# Patient Record
Sex: Female | Born: 1980
Health system: Southern US, Community
[De-identification: ages and names within clinical notes are randomized; demographics above are authoritative.]

## PROBLEM LIST (undated history)

## (undated) DIAGNOSIS — F419 Anxiety disorder, unspecified: Secondary | ICD-10-CM

## (undated) DIAGNOSIS — N939 Abnormal uterine and vaginal bleeding, unspecified: Secondary | ICD-10-CM

## (undated) DIAGNOSIS — Z87442 Personal history of urinary calculi: Secondary | ICD-10-CM

## (undated) DIAGNOSIS — Z9889 Other specified postprocedural states: Secondary | ICD-10-CM

## (undated) DIAGNOSIS — Z8659 Personal history of other mental and behavioral disorders: Secondary | ICD-10-CM

## (undated) HISTORY — PX: TONSILLECTOMY: SUR1361

## (undated) HISTORY — PX: WISDOM TOOTH EXTRACTION: SHX21

## (undated) HISTORY — PX: TYMPANOSTOMY TUBE PLACEMENT: SHX32

---

## 2008-01-15 ENCOUNTER — Inpatient Hospital Stay (HOSPITAL_COMMUNITY): Admission: AD | Admit: 2008-01-15 | Discharge: 2008-01-18 | Payer: Self-pay | Admitting: Obstetrics and Gynecology

## 2011-08-03 LAB — CBC
Hemoglobin: 11.3 — ABNORMAL LOW
MCHC: 35
MCV: 90.1
RBC: 3.49 — ABNORMAL LOW
RBC: 4.35
WBC: 13.3 — ABNORMAL HIGH
WBC: 18.3 — ABNORMAL HIGH

## 2011-08-03 LAB — COMPREHENSIVE METABOLIC PANEL
ALT: 39 — ABNORMAL HIGH
AST: 48 — ABNORMAL HIGH
CO2: 24
Chloride: 103
GFR calc Af Amer: >60
GFR calc non Af Amer: >60
Sodium: 134 — ABNORMAL LOW
Total Bilirubin: 0.7

## 2011-08-03 LAB — URIC ACID: Uric Acid, Serum: 4.9

## 2014-09-06 ENCOUNTER — Other Ambulatory Visit (HOSPITAL_COMMUNITY)
Admission: RE | Admit: 2014-09-06 | Discharge: 2014-09-06 | Disposition: A | Discharge status: Home / Self Care | Payer: Self-pay | Source: Ambulatory Visit | Attending: Family Medicine | Admitting: Family Medicine

## 2014-09-06 ENCOUNTER — Other Ambulatory Visit: Payer: Self-pay | Admitting: Family Medicine

## 2014-09-06 DIAGNOSIS — Z01419 Encounter for gynecological examination (general) (routine) without abnormal findings: Secondary | ICD-10-CM | POA: Insufficient documentation

## 2014-09-07 LAB — CYTOLOGY - PAP

## 2017-06-09 ENCOUNTER — Other Ambulatory Visit: Payer: Self-pay | Admitting: Physician Assistant

## 2017-06-09 ENCOUNTER — Ambulatory Visit
Admission: RE | Admit: 2017-06-09 | Discharge: 2017-06-09 | Disposition: A | Discharge status: Home / Self Care | Payer: Self-pay | Source: Ambulatory Visit | Attending: Physician Assistant | Admitting: Physician Assistant

## 2017-06-09 DIAGNOSIS — M542 Cervicalgia: Secondary | ICD-10-CM
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
## 2019-08-23 ENCOUNTER — Other Ambulatory Visit (HOSPITAL_COMMUNITY)
Admission: RE | Admit: 2019-08-23 | Discharge: 2019-08-23 | Disposition: A | Discharge status: Home / Self Care | Payer: No Typology Code available for payment source | Source: Ambulatory Visit | Attending: Physician Assistant | Admitting: Physician Assistant

## 2019-08-23 ENCOUNTER — Other Ambulatory Visit: Payer: Self-pay | Admitting: Physician Assistant

## 2019-08-23 DIAGNOSIS — Z0000 Encounter for general adult medical examination without abnormal findings: Secondary | ICD-10-CM | POA: Diagnosis not present

## 2019-08-29 LAB — CYTOLOGY - PAP
Comment: NEGATIVE
Diagnosis: NEGATIVE
High risk HPV: POSITIVE — AB

## 2020-02-15 ENCOUNTER — Encounter (HOSPITAL_BASED_OUTPATIENT_CLINIC_OR_DEPARTMENT_OTHER): Payer: Self-pay | Admitting: Obstetrics & Gynecology

## 2020-02-15 ENCOUNTER — Other Ambulatory Visit: Payer: Self-pay

## 2020-02-15 NOTE — Progress Notes (Signed)
Spoke w/ via phone for pre-op interview--- PT Lab needs dos----  Urine preg            Lab results---- getting CBC, BMP done 02-20-2020 @ 1515 COVID test ------  02-20-2020 @ 1445 Arrive at -------  1030 NPO after ------  MN Medications to take morning of surgery ----- NONE Diabetic medication ----- n/a Patient Special Instructions ----- n/a Pre-Op special Istructions ----- n/a Patient verbalized understanding of instructions that were given at this phone interview. Patient denies shortness of breath, chest pain, fever, cough a this phone interview.

## 2020-02-17 NOTE — H&P (Signed)
39yo G2P2 who presents for hysteroscopy, D&C,possible polypectomy and HTA ablation due to AUB        Previously noted long standing h/o irregular bleeding. Sometimes her periods are monthly or even twice a month, sometimes she may go up to 53mos without a period. Her cycles have always been unpredictable. Usually her period will last about a week with 2-3 heavy days. However, at times, her period has lasted up to 3 weeks. On a heavy day she may soak through a tampon in under . with passage of clots. Moderate dysmenorrhea        She also notes postcoital bleeding- not all the time. Bleeding will be bright red to brown spotting. In the past, she had tried OCPs to help regulate her period, but noted significant mood changes and would prefer to avoid hormones.         Work up included:        EMB (12/06/19)- benign proliferative endometrium, endomtrial polyp        SHG (12/25/19): 7.4cm retroverted uterus- normal size and hsape. Thickened endometrium- following SHG hyperechoic 1.8cm fundal mass- no BF noted. Bilateral ovaries with multiple tiny follicles seen throughout. No adnexal masses seen        Contraception: vasectomy        08/2019- Pap neg, HPV+, []  repeat exam due 08/2020        08/23/2019: Hgb 14.9.    Current Medications  TakingXanax(ALPRAZolam) 0.25 MG Tablet 1 tablet as needed Orally (30 day supply) Three times a day, Notes: as needed    MVI as directed    Probiotic Capsule Orally      Past Medical History       Irregular menses.    Surgical History  tonsillectomy      Family History  Father: alive 59 yrs, hypertension, diagnosed with Hypertension  Mother: alive 68 yrs, healthy  Paternal Grand Father: deceased 71 yrs, pacemaker  Paternal Grand Mother: deceased, renal disease dialysis 17  Maternal Grand Father: deceased, renal disease  Maternal Grand Mother: alive 44 yrs, diabetes, diet controlled  Brother 1: alive, healthy  Sister 1: alive, healthy  Sister 2: alive,  healthy  1 brother(s) , 2 sister(s) . 2 son(s) - healthy.       Social History  General:         Tobacco use               cigarettes:  Former smoker             Quit in year  2019 off an on since 2008             Tobacco history last updated  02/14/2020             Vaping  No       Alcohol: yes, Rare, 2+ per week, wine.        Caffeine: coffee, green tea.        no Recreational drug use.        Exercise: 3-4 weekly.        Marital Status: Married, 04/15/2020..        Children: Ivin Booty.        EDUCATION: [employer] and 4 years of college.        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        Religion: none.        Seat belt use: yes.  no Tobacco Exposure.     Gyn History  Sexual activity currently sexually active.   Periods : irregular.   LMP 02-04-2020.   Birth control vasectomy.   Last pap smear date 08-2019 normal .   Abnormal pap smear none.      OB History  Number of pregnancies  2.   Pregnancy # 1  vaginal delivery.   Pregnancy # 2  vaginal delivery.      Allergies  N.K.D.A.      Hospitalization/Major Diagnostic Procedure  kidney infection second grade   childbirth x 2 NVD       Review of Systems  CONSTITUTIONAL:          no Chills.  Fatigue yes.  no Fever.  Night sweats yes.      HEENT:          Blurrred vision no.  no Double vision.      CARDIOLOGY:          no Chest pain.      RESPIRATORY:          no Shortness of breath.  no Cough.      UROLOGY:          Urinary urgency yes.  Urinary frequency yes.  no Urinary incontinence.      GASTROENTEROLOGY:          no Abdominal pain.  no Appetite change.  no Change in bowel movements.      FEMALE REPRODUCTIVE:          See HPI for details.  no Breast lumps or discharge.  no Breast pain.      NEUROLOGY:          no Dizziness.  no Headache.  no Loss of consciousness.      PSYCHOLOGY:          Anxiety yes.  no Depression.      SKIN:          no Rash.  no  Hives.      HEMATOLOGY/LYMPH:          no Anemia.  Using Blood Thinners no.        Examination  Vital Signs  Wt 137.2, Wt change -.8 lb, Ht 66.5, BMI 21.81, Temp 97.0, BP sitting 112/72.    Examination  General Examination:        CONSTITUTIONAL: well developed, well nourished.         SKIN: warm and dry, no rashes.         NECK: supple, normal appearance.         LUNGS: clear to auscultation bilaterally, no wheezes, rhonchi, rales.         HEART: no murmurs, regular rate and rhythm.         ABDOMEN:  soft and not tender.         FEMALE GENITOURINARY:  normal external genitalia, labia - unremarkable, vagina - pink moist mucosa, no lesions or abnormal discharge, cervix - no discharge or lesions or CMT.         MUSCULOSKELETAL  no calf tenderness bilaterally.         EXTREMITIES:  no edema present.         PSYCH:  appropriate mood and affect.      A/P: 39yo G2P2 who presents for hysteroscopy, D&C,possible polypectomy and HTA ablation due to AUB  -NPO -LR @ 125cc/hr -SCDs to OR -Risk/benefits reviewed with patient including but not limited to risk of bleeding,  infection and injury or potential failure of the ablation.  Questions and concerns were addressed and she desires to proceed  Janyth Pupa, DO [phone] (cell) [phone] (office)

## 2020-02-20 ENCOUNTER — Other Ambulatory Visit: Payer: Self-pay

## 2020-02-20 ENCOUNTER — Other Ambulatory Visit (HOSPITAL_COMMUNITY)
Admission: RE | Admit: 2020-02-20 | Discharge: 2020-02-20 | Disposition: A | Discharge status: Home / Self Care | Payer: No Typology Code available for payment source | Source: Ambulatory Visit | Attending: Obstetrics & Gynecology | Admitting: Obstetrics & Gynecology

## 2020-02-20 ENCOUNTER — Encounter (HOSPITAL_COMMUNITY)
Admission: RE | Admit: 2020-02-20 | Discharge: 2020-02-20 | Disposition: A | Discharge status: Home / Self Care | Payer: No Typology Code available for payment source | Source: Ambulatory Visit

## 2020-02-20 DIAGNOSIS — Z20822 Contact with and (suspected) exposure to covid-19: Secondary | ICD-10-CM | POA: Insufficient documentation

## 2020-02-20 DIAGNOSIS — Z01812 Encounter for preprocedural laboratory examination: Secondary | ICD-10-CM | POA: Insufficient documentation

## 2020-02-20 LAB — BASIC METABOLIC PANEL
Anion gap: 9 (ref 5–15)
BUN: 10 mg/dL (ref 6–20)
CO2: 27 mmol/L (ref 22–32)
Calcium: 9.7 mg/dL (ref 8.9–10.3)
Chloride: 102 mmol/L (ref 98–111)
Creatinine, Ser: 0.79 mg/dL (ref 0.44–1.00)
GFR calc Af Amer: >60 mL/min (ref >60)
GFR calc non Af Amer: >60 mL/min (ref >60)
Glucose, Bld: 87 mg/dL (ref 70–99)
Potassium: 4.3 mmol/L (ref 3.5–5.1)
Sodium: 138 mmol/L (ref 135–145)

## 2020-02-20 LAB — CBC
HCT: 44 % (ref 36.0–46.0)
Hemoglobin: 14.5 g/dL (ref 12.0–15.0)
MCH: 31.1 pg (ref 26.0–34.0)
MCHC: 33 g/dL (ref 30.0–36.0)
MCV: 94.4 fL (ref 80.0–100.0)
Platelets: 275 10*3/uL (ref 150–400)
RBC: 4.66 MIL/uL (ref 3.87–5.11)
RDW: 11.9 % (ref 11.5–15.5)
WBC: 7.9 10*3/uL (ref 4.0–10.5)
nRBC: 0 % (ref 0.0–0.2)

## 2020-02-20 LAB — SARS CORONAVIRUS 2 (TAT 6-24 HRS): SARS Coronavirus 2: NEGATIVE

## 2020-02-23 ENCOUNTER — Ambulatory Visit (HOSPITAL_BASED_OUTPATIENT_CLINIC_OR_DEPARTMENT_OTHER): Payer: No Typology Code available for payment source | Admitting: Anesthesiology

## 2020-02-23 ENCOUNTER — Other Ambulatory Visit: Payer: Self-pay

## 2020-02-23 ENCOUNTER — Encounter (HOSPITAL_BASED_OUTPATIENT_CLINIC_OR_DEPARTMENT_OTHER)
Admission: RE | Disposition: A | Discharge status: Home / Self Care | Payer: Self-pay | Source: Home / Self Care | Attending: Obstetrics & Gynecology

## 2020-02-23 ENCOUNTER — Ambulatory Visit (HOSPITAL_BASED_OUTPATIENT_CLINIC_OR_DEPARTMENT_OTHER)
Admission: RE | Admit: 2020-02-23 | Discharge: 2020-02-23 | Disposition: A | Discharge status: Home / Self Care | Payer: No Typology Code available for payment source | Attending: Obstetrics & Gynecology | Admitting: Obstetrics & Gynecology

## 2020-02-23 ENCOUNTER — Encounter (HOSPITAL_BASED_OUTPATIENT_CLINIC_OR_DEPARTMENT_OTHER): Payer: Self-pay | Admitting: Obstetrics & Gynecology

## 2020-02-23 DIAGNOSIS — Z8249 Family history of ischemic heart disease and other diseases of the circulatory system: Secondary | ICD-10-CM | POA: Diagnosis not present

## 2020-02-23 DIAGNOSIS — F419 Anxiety disorder, unspecified: Secondary | ICD-10-CM | POA: Insufficient documentation

## 2020-02-23 DIAGNOSIS — Z87891 Personal history of nicotine dependence: Secondary | ICD-10-CM | POA: Insufficient documentation

## 2020-02-23 DIAGNOSIS — N840 Polyp of corpus uteri: Secondary | ICD-10-CM | POA: Diagnosis not present

## 2020-02-23 DIAGNOSIS — N938 Other specified abnormal uterine and vaginal bleeding: Secondary | ICD-10-CM | POA: Insufficient documentation

## 2020-02-23 DIAGNOSIS — Z841 Family history of disorders of kidney and ureter: Secondary | ICD-10-CM | POA: Diagnosis not present

## 2020-02-23 DIAGNOSIS — Z833 Family history of diabetes mellitus: Secondary | ICD-10-CM | POA: Diagnosis not present

## 2020-02-23 DIAGNOSIS — N939 Abnormal uterine and vaginal bleeding, unspecified: Secondary | ICD-10-CM

## 2020-02-23 HISTORY — PX: DILITATION & CURRETTAGE/HYSTROSCOPY WITH HYDROTHERMAL ABLATION: SHX5570

## 2020-02-23 HISTORY — DX: Abnormal uterine and vaginal bleeding, unspecified: N93.9

## 2020-02-23 HISTORY — DX: Anxiety disorder, unspecified: F41.9

## 2020-02-23 HISTORY — DX: Personal history of other mental and behavioral disorders: Z86.59

## 2020-02-23 LAB — POCT PREGNANCY, URINE: Preg Test, Ur: NEGATIVE

## 2020-02-23 SURGERY — DILATATION & CURETTAGE/HYSTEROSCOPY WITH HYDROTHERMAL ABLATION
Anesthesia: General | Site: Uterus

## 2020-02-23 MED ORDER — KETOROLAC TROMETHAMINE 30 MG/ML IJ SOLN
INTRAMUSCULAR | Status: DC | PRN
  Administered 2020-02-23: 30 mg via INTRAVENOUS

## 2020-02-23 MED ORDER — FENTANYL CITRATE (PF) 100 MCG/2ML IJ SOLN
INTRAMUSCULAR | Status: AC
  Filled 2020-02-23: qty 2

## 2020-02-23 MED ORDER — PROPOFOL 10 MG/ML IV BOLUS
INTRAVENOUS | Status: AC
  Filled 2020-02-23: qty 20

## 2020-02-23 MED ORDER — LACTATED RINGERS IV SOLN
INTRAVENOUS | Status: DC
  Filled 2020-02-23: qty 1000

## 2020-02-23 MED ORDER — LIDOCAINE-EPINEPHRINE 1 %-1:100000 IJ SOLN
INTRAMUSCULAR | Status: AC
  Filled 2020-02-23: qty 1

## 2020-02-23 MED ORDER — SCOPOLAMINE 1 MG/3DAYS TD PT72
MEDICATED_PATCH | TRANSDERMAL | Status: DC | PRN
  Administered 2020-02-23: 1 via TRANSDERMAL

## 2020-02-23 MED ORDER — ACETAMINOPHEN 500 MG PO TABS
1000.0000 mg | ORAL_TABLET | Freq: Once | ORAL | Status: AC
  Administered 2020-02-23: 1000 mg via ORAL
  Filled 2020-02-23: qty 2

## 2020-02-23 MED ORDER — MIDAZOLAM HCL 2 MG/2ML IJ SOLN
INTRAMUSCULAR | Status: AC
  Filled 2020-02-23: qty 2

## 2020-02-23 MED ORDER — OXYCODONE HCL 5 MG PO TABS
5.0000 mg | ORAL_TABLET | Freq: Once | ORAL | Status: DC | PRN
  Filled 2020-02-23: qty 1

## 2020-02-23 MED ORDER — ACETAMINOPHEN 500 MG PO TABS
ORAL_TABLET | ORAL | Status: AC
  Filled 2020-02-23: qty 2

## 2020-02-23 MED ORDER — LIDOCAINE 2% (20 MG/ML) 5 ML SYRINGE
INTRAMUSCULAR | Status: DC | PRN
  Administered 2020-02-23: 60 mg via INTRAVENOUS

## 2020-02-23 MED ORDER — LIDOCAINE 2% (20 MG/ML) 5 ML SYRINGE
INTRAMUSCULAR | Status: AC
  Filled 2020-02-23: qty 5

## 2020-02-23 MED ORDER — FENTANYL CITRATE (PF) 100 MCG/2ML IJ SOLN
INTRAMUSCULAR | Status: DC | PRN
  Administered 2020-02-23: 50 ug via INTRAVENOUS

## 2020-02-23 MED ORDER — LIDOCAINE-EPINEPHRINE 1 %-1:100000 IJ SOLN
INTRAMUSCULAR | Status: DC | PRN
  Administered 2020-02-23: 20 mL

## 2020-02-23 MED ORDER — DEXAMETHASONE SODIUM PHOSPHATE 10 MG/ML IJ SOLN
INTRAMUSCULAR | Status: DC | PRN
  Administered 2020-02-23: 10 mg via INTRAVENOUS

## 2020-02-23 MED ORDER — PROMETHAZINE HCL 25 MG/ML IJ SOLN
6.2500 mg | Freq: Once | INTRAMUSCULAR | Status: AC | PRN
  Administered 2020-02-23: 6.25 mg via INTRAVENOUS
  Filled 2020-02-23: qty 1

## 2020-02-23 MED ORDER — MIDAZOLAM HCL 2 MG/2ML IJ SOLN
INTRAMUSCULAR | Status: DC | PRN
  Administered 2020-02-23: 1 mg via INTRAVENOUS
  Administered 2020-02-23: 2 mg via INTRAVENOUS

## 2020-02-23 MED ORDER — FENTANYL CITRATE (PF) 100 MCG/2ML IJ SOLN
25.0000 ug | INTRAMUSCULAR | Status: DC | PRN
  Filled 2020-02-23: qty 1

## 2020-02-23 MED ORDER — DEXAMETHASONE SODIUM PHOSPHATE 10 MG/ML IJ SOLN
INTRAMUSCULAR | Status: AC
  Filled 2020-02-23: qty 1

## 2020-02-23 MED ORDER — ONDANSETRON HCL 4 MG/2ML IJ SOLN
INTRAMUSCULAR | Status: AC
  Filled 2020-02-23: qty 2

## 2020-02-23 MED ORDER — SODIUM CHLORIDE 0.9 % IR SOLN
Status: DC | PRN
  Administered 2020-02-23: 3000 mL

## 2020-02-23 MED ORDER — PROMETHAZINE HCL 25 MG/ML IJ SOLN
INTRAMUSCULAR | Status: AC
  Filled 2020-02-23: qty 1

## 2020-02-23 MED ORDER — ONDANSETRON HCL 4 MG/2ML IJ SOLN
INTRAMUSCULAR | Status: DC | PRN
  Administered 2020-02-23: 4 mg via INTRAVENOUS

## 2020-02-23 MED ORDER — SILVER NITRATE-POT NITRATE 75-25 % EX MISC
CUTANEOUS | Status: AC
  Filled 2020-02-23: qty 10

## 2020-02-23 MED ORDER — KETOROLAC TROMETHAMINE 30 MG/ML IJ SOLN
INTRAMUSCULAR | Status: AC
  Filled 2020-02-23: qty 1

## 2020-02-23 MED ORDER — PROPOFOL 10 MG/ML IV BOLUS
INTRAVENOUS | Status: DC | PRN
  Administered 2020-02-23: 200 mg via INTRAVENOUS

## 2020-02-23 SURGICAL SUPPLY — 17 items
CATH ROBINSON RED A/P 16FR (CATHETERS) IMPLANT
DEVICE MYOSURE REACH (MISCELLANEOUS) ×4 IMPLANT
DILATOR CANAL MILEX (MISCELLANEOUS) IMPLANT
GAUZE 4X4 16PLY RFD (DISPOSABLE) ×4 IMPLANT
GLOVE BIOGEL PI IND STRL 6.5 (GLOVE) ×2 IMPLANT
GLOVE BIOGEL PI IND STRL 7.0 (GLOVE) ×2 IMPLANT
GLOVE BIOGEL PI INDICATOR 6.5 (GLOVE) ×2
GLOVE BIOGEL PI INDICATOR 7.0 (GLOVE) ×2
GLOVE ECLIPSE 6.5 STRL STRAW (GLOVE) ×4 IMPLANT
GOWN STRL REUS W/ TWL LRG LVL3 (GOWN DISPOSABLE) ×2 IMPLANT
GOWN STRL REUS W/TWL LRG LVL3 (GOWN DISPOSABLE) ×6 IMPLANT
PACK VAGINAL MINOR WOMEN LF (CUSTOM PROCEDURE TRAY) ×4 IMPLANT
PAD OB MATERNITY 4.3X12.25 (PERSONAL CARE ITEMS) ×4 IMPLANT
PAD PREP 24X48 CUFFED NSTRL (MISCELLANEOUS) ×4 IMPLANT
SET GENESYS HTA PROCERVA (MISCELLANEOUS) ×4 IMPLANT
SLEEVE SCD COMPRESS KNEE MED (MISCELLANEOUS) ×8 IMPLANT
TOWEL OR 17X26 10 PK STRL BLUE (TOWEL DISPOSABLE) ×4 IMPLANT

## 2020-02-23 NOTE — Transfer of Care (Signed)
   Last Vitals:  Vitals Value Taken Time  BP 145/96 02/23/20 1321  Temp    Pulse 84 02/23/20 1323  Resp 17 02/23/20 1323  SpO2 100 % 02/23/20 1323  Vitals shown include unvalidated device data.  Last Pain:  Vitals:   02/23/20 1028  TempSrc: Oral  PainSc: 0-No pain      Patients Stated Pain Goal: 6 (02/23/20 1028) Immediate Anesthesia Transfer of Care Note  Patient: Paytyn Breitenstein  Procedure(s) Performed: Procedure(s) (LRB): DILATATION & CURETTAGE/HYSTEROSCOPY WITH HYDROTHERMAL ABLATION AND POLYPECTOMY WITH MYOSURE (N/A)  Patient Location: PACU  Anesthesia Type: General  Level of Consciousness: awake, alert  and oriented  Airway & Oxygen Therapy: Patient Spontanous Breathing and Patient connected to nasal cannula oxygen  Post-op Assessment: Report given to PACU RN and Post -op Vital signs reviewed and stable  Post vital signs: Reviewed and stable  Complications: No apparent anesthesia complications

## 2020-02-23 NOTE — Op Note (Signed)
Operative Report  PreOp: 1) abnormal uterine bleeding 2) uterine polyp PostOp: same Procedure:  Hysteroscopy, Dilation and Curettage, polypectomy, Endometrial ablation Surgeon: Dr. Myna Hidalgo Anesthesia: General and cervical block Complications:none EBL: UOP: 10cc IVF:1000cc  Discrepancy: 290cc  Findings:8cm retroverted uterus with proliferative endometrium and 1cm uterine polyp, both ostia visualized  Specimens: endometrial polyp and curettings  Procedure: The patient was taken to the operating room where she underwent general anesthesia without difficulty. The patient was placed in a low lithotomy position using Allen stirrups. The patient was examined with the findings as noted above.  She was then prepped and draped in the normal sterile fashion. The bladder was drained using a red rubber urethral catheter.20cc of 1% lidocaine with epinephrine was used for a cervical block.  A sterile speculum was inserted into the vagina. A single tooth tenaculum was placed on the anterior lip of the cervix. The uterus was then sounded to 8cm. The diagnostic hysteroscope was then inserted without difficulty and noted to have the findings as listed above. The myosure was then used for resection of the polyp and for curettage.  The hysteroscope was removed and the tissue was sent to pathology.   Attention was then turned to the HTA ablation. The system was set up according to manufacture instructions. Ablation was completed under direct visualization. Global ablation was visualized and no uterine perforation was seen. All instrument were then removed. Hemostasis was observed at the cervical site. The patient was repositioned to the supine position. The patient tolerated the procedure without any complications and taken to recovery in stable condition.   Myna Hidalgo, DO [phone] (pager) [phone] (office)

## 2020-02-23 NOTE — Discharge Instructions (Addendum)
HOME INSTRUCTIONS  Please note any unusual or excessive bleeding, pain, swelling. Mild dizziness or drowsiness are normal for about 24 hours after surgery.   Shower when comfortable  Restrictions: No driving for 24 hours or while taking pain medications.  Activity:  No heavy lifting (> 10 lbs), nothing in vagina (no tampons, douching, or intercourse) x 2 weeks; no tub baths for 2 weeks Vaginal spotting is expected but if your bleeding is heavy, period like,  please call the office    Diet:  You may return to your regular diet.  Do not eat large meals.  Eat small frequent meals throughout the day.  Continue to drink a good amount of water at least 6-8 glasses of water per day, hydration is very important for the healing process.  Pain Management: Take Motrin and/or tylenol as needed for pain.  Always take prescription pain medication with food, it may cause constipation, increase fluids and fiber and you may want to take an over-the-counter stool softener like Colace as needed up to 2x a day.    Alcohol -- Avoid for 24 hours and while taking pain medications.  Nausea: Take sips of ginger ale or soda  Fever -- Call physician if temperature over 101 degrees  Follow up:  If you do not already have a follow up appointment scheduled, please call the office at [phone].  If you experience fever (a temperature greater than 100.4), pain unrelieved by pain medication, shortness of breath, swelling of a single leg, or any other symptoms which are concerning to you please the office immediately.   Post Anesthesia Home Care Instructions  Activity: Get plenty of rest for the remainder of the day. A responsible adult should stay with you for 24 hours following the procedure.  For the next 24 hours, DO NOT: -Drive a car -Advertising copywriter -Drink alcoholic beverages -Take any medication unless instructed by your physician -Make any legal decisions or sign important papers.  Meals: Start  with liquid foods such as gelatin or soup. Progress to regular foods as tolerated. Avoid greasy, spicy, heavy foods. If nausea and/or vomiting occur, drink only clear liquids until the nausea and/or vomiting subsides. Call your physician if vomiting continues.  Special Instructions/Symptoms: Your throat may feel dry or sore from the anesthesia or the breathing tube placed in your throat during surgery. If this causes discomfort, gargle with warm salt water. The discomfort should disappear within 24 hours.  If you had a scopolamine patch placed behind your ear for the management of post- operative nausea and/or vomiting:  1. The medication in the patch is effective for 72 hours, after which it should be removed.  Wrap patch in a tissue and discard in the trash. Wash hands thoroughly with soap and water. 2. You may remove the patch earlier than 72 hours if you experience unpleasant side effects which may include dry mouth, dizziness or visual disturbances. 3. Avoid touching the patch. Wash your hands with soap and water after contact with the patch.

## 2020-02-23 NOTE — Anesthesia Procedure Notes (Addendum)
Procedure Name: LMA Insertion Date/Time: 02/23/2020 12:37 PM Performed by: Norva Pavlov, CRNA Pre-anesthesia Checklist: Patient identified, Emergency Drugs available, Suction available and Patient being monitored Patient Re-evaluated:Patient Re-evaluated prior to induction Oxygen Delivery Method: Circle system utilized Preoxygenation: Pre-oxygenation with 100% oxygen Induction Type: IV induction Ventilation: Mask ventilation without difficulty LMA: LMA inserted LMA Size: 3.0 Number of attempts: 2 (Attempted to place 4LMA without success due to small mouth.  3 LMA placed with ease.Marland Kitchen) Airway Equipment and Method: Bite block Placement Confirmation: positive ETCO2 Tube secured with: Tape Dental Injury: Teeth and Oropharynx as per pre-operative assessment

## 2020-02-23 NOTE — Interval H&P Note (Signed)
History and Physical Interval Note:  02/23/2020 12:10 PM  Connie Turner  has presented today for surgery, with the diagnosis of N93.9 abnormal uterine bleeding N84.0 uterine polyp.  The various methods of treatment have been discussed with the patient and family. After consideration of risks, benefits and other options for treatment, the patient has consented to  Procedure(s): DILATATION & CURETTAGE/HYSTEROSCOPY WITH HYDROTHERMAL ABLATION AND POLYPECTOMY WITH MYOSURE (N/A) DILATATION & CURETTAGE/HYSTEROSCOPY WITH MYOSURE (N/A) as a surgical intervention.  The patient's history has been reviewed, patient examined, no change in status, stable for surgery.  I have reviewed the patient's chart and labs.  Questions were answered to the patient's satisfaction.     Sharon Seller

## 2020-02-23 NOTE — Anesthesia Preprocedure Evaluation (Addendum)
Anesthesia Evaluation  Patient identified by MRN, date of birth, ID band Patient awake    Reviewed: Allergy & Precautions, NPO status , Patient's Chart, lab work & pertinent test results  Airway Mallampati: II  TM Distance: >3 FB Neck ROM: Full    Dental no notable dental hx. (+) Teeth Intact, Dental Advisory Given   Pulmonary neg pulmonary ROS, former smoker,    Pulmonary exam normal breath sounds clear to auscultation       Cardiovascular negative cardio ROS Normal cardiovascular exam Rhythm:Regular Rate:Normal     Neuro/Psych PSYCHIATRIC DISORDERS Anxiety negative neurological ROS     GI/Hepatic negative GI ROS, Neg liver ROS,   Endo/Other  negative endocrine ROS  Renal/GU negative Renal ROS  negative genitourinary   Musculoskeletal negative musculoskeletal ROS (+)   Abdominal   Peds  Hematology negative hematology ROS (+)   Anesthesia Other Findings AUB  Reproductive/Obstetrics                            Anesthesia Physical Anesthesia Plan  ASA: II  Anesthesia Plan: General   Post-op Pain Management:    Induction: Intravenous  PONV Risk Score and Plan: 3 and Ondansetron, Dexamethasone and Midazolam  Airway Management Planned: LMA  Additional Equipment:   Intra-op Plan:   Post-operative Plan: Extubation in OR  Informed Consent: I have reviewed the patients History and Physical, chart, labs and discussed the procedure including the risks, benefits and alternatives for the proposed anesthesia with the patient or authorized representative who has indicated his/her understanding and acceptance.     Dental advisory given  Plan Discussed with: CRNA  Anesthesia Plan Comments:         Anesthesia Quick Evaluation

## 2020-02-26 LAB — SURGICAL PATHOLOGY

## 2020-02-26 NOTE — Anesthesia Postprocedure Evaluation (Signed)
Anesthesia Post Note  Patient: Sohana Zakarian  Procedure(s) Performed: DILATATION & CURETTAGE/HYSTEROSCOPY WITH HYDROTHERMAL ABLATION AND POLYPECTOMY WITH MYOSURE (N/A Uterus)     Patient location during evaluation: PACU Anesthesia Type: General Level of consciousness: awake and alert Pain management: pain level controlled Vital Signs Assessment: post-procedure vital signs reviewed and stable Respiratory status: spontaneous breathing, nonlabored ventilation, respiratory function stable and patient connected to nasal cannula oxygen Cardiovascular status: blood pressure returned to baseline and stable Postop Assessment: no apparent nausea or vomiting Anesthetic complications: no    Last Vitals:  Vitals:   02/23/20 1400 02/23/20 1500  BP: (!) 155/91 (!) 155/98  Pulse: 64 64  Resp: 16 16  Temp:  36.6 C  SpO2: 100% 100%    Last Pain:  Vitals:   02/23/20 1500  TempSrc:   PainSc: 6                  Harlon Kutner L Lakevia Perris

## 2020-10-14 ENCOUNTER — Other Ambulatory Visit (HOSPITAL_COMMUNITY): Payer: Self-pay | Admitting: Physician Assistant

## 2020-10-14 MED FILL — ALPRAZolam 0.25 MG TABS: 0.25 | 30 days supply | Qty: 30 | Fill #0

## 2020-10-24 MED FILL — ALPRAZolam 0.25 MG TABS: 0.25 | 10 days supply | Qty: 30 | Fill #0

## 2020-12-03 ENCOUNTER — Other Ambulatory Visit (HOSPITAL_COMMUNITY): Payer: Self-pay | Admitting: Family Medicine

## 2020-12-03 MED FILL — FLUTICASONE PROP 50 MCG SPR: 50 | 30 days supply | Qty: 16 | Fill #0

## 2020-12-04 ENCOUNTER — Other Ambulatory Visit: Payer: Self-pay

## 2020-12-04 DIAGNOSIS — I8393 Asymptomatic varicose veins of bilateral lower extremities: Secondary | ICD-10-CM

## 2020-12-16 ENCOUNTER — Other Ambulatory Visit (HOSPITAL_COMMUNITY): Payer: Self-pay | Admitting: Physician Assistant

## 2020-12-16 MED FILL — predniSONE 10 MG (21) TBPK: 10 | 6 days supply | Qty: 21 | Fill #0

## 2020-12-25 ENCOUNTER — Ambulatory Visit (INDEPENDENT_AMBULATORY_CARE_PROVIDER_SITE_OTHER): Payer: No Typology Code available for payment source | Admitting: Physician Assistant

## 2020-12-25 ENCOUNTER — Ambulatory Visit (HOSPITAL_COMMUNITY)
Admission: RE | Admit: 2020-12-25 | Discharge: 2020-12-25 | Disposition: A | Discharge status: Home / Self Care | Payer: No Typology Code available for payment source | Source: Ambulatory Visit | Attending: Vascular Surgery | Admitting: Vascular Surgery

## 2020-12-25 ENCOUNTER — Other Ambulatory Visit: Payer: Self-pay

## 2020-12-25 VITALS — BP 126/88 | HR 95 | Temp 98.6°F | Resp 20 | Ht 66.0 in | Wt 146.6 lb

## 2020-12-25 DIAGNOSIS — I8393 Asymptomatic varicose veins of bilateral lower extremities: Secondary | ICD-10-CM | POA: Diagnosis present

## 2020-12-25 NOTE — Progress Notes (Signed)
VASCULAR & VEIN SPECIALISTS           [facility] [place]  History and Physical   Connie Turner is a 40 y.o. female who presents with varicose veins in both legs.  She states that her mother and her grandmother both have varicose veins and swelling and she is wanting to get ahead of it now.  She has hx of pregnancy with the first being in 2008.  She states that she started getting cramping at night in her legs after that.  She does not get cramping in her legs any other time.  She does not have hx of DVT.  She does not really have any swelling in her legs at this point.  She states that when her feet are cold, they turn a purplish color.  She states that she has a small bulging vein on the right inner thigh.  She does have some small spider veins on the backs of her legs. She has not ever worn compression socks.  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   The pt is not on a statin for cholesterol management.  The pt is not on a daily aspirin.   Other AC:  none The pt is not on medication for hypertension.   The pt is not diabetic.   Tobacco hx:  former   Past Medical History:  Diagnosis Date  . Abnormal uterine bleeding (AUB)   . Anxiety   . History of panic attacks     Past Surgical History:  Procedure Laterality Date  . DILITATION & CURRETTAGE/HYSTROSCOPY WITH HYDROTHERMAL ABLATION N/A 02/23/2020   Procedure: DILATATION & CURETTAGE/HYSTEROSCOPY WITH HYDROTHERMAL ABLATION AND POLYPECTOMY WITH MYOSURE;  Surgeon: Myna Hidalgozan, Jennifer, DO;  Location: [facility];  Service: Gynecology;  Laterality: N/A;  . TONSILLECTOMY  child   and tympanostomy tube placement  . TYMPANOSTOMY TUBE PLACEMENT Bilateral child    Social History   Socioeconomic History  . Marital status: Married    Spouse name: Not on file  . Number of children: Not on file  . Years of education: Not on file  . Highest education level: Not on file   Occupational History  . Not on file  Tobacco Use  . Smoking status: Former Smoker    Years: 10.00    Types: Cigarettes    Quit date: 02/01/2020    Years since quitting: 0.8  . Smokeless tobacco: Never Used  . Tobacco comment: 02-15-2020  last smoked 2 wks ago, average 1 cig per day,  is an off and on smoker  Vaping Use  . Vaping Use: Never used  Substance and Sexual Activity  . Alcohol use: Yes    Comment: occasional  . Drug use: Never  . Sexual activity: Not on file  Other Topics Concern  . Not on file  Social History Narrative  . Not on file   Social Determinants of Health   Financial Resource Strain: Not on file  Food Insecurity: Not on file  Transportation Needs: Not on file  Physical Activity: Not on file  Stress: Not on file  Social Connections: Not on file  Intimate Partner Violence: Not on file    Family History  Problem Relation Age of Onset  . Varicose Veins Mother   . Varicose Veins Maternal Grandmother     Current Outpatient Medications  Medication Sig Dispense Refill  . ALPRAZolam Prudy Feeler(XANAX)  0.25 MG tablet Take 0.25 mg by mouth 3 (three) times daily as needed for anxiety.    . diphenhydrAMINE HCl (BENADRYL ALLERGY PO) Take by mouth as needed.    . fluticasone (FLONASE) 50 MCG/ACT nasal spray 1 spray in each nostril    . ibuprofen (ADVIL) 200 MG tablet Take 200 mg by mouth every 6 (six) hours as needed.    . Multiple Vitamin (MULTIVITAMIN) tablet Take 1 tablet by mouth daily.    . Probiotic Product (PROBIOTIC DAILY) CAPS Take by mouth daily.    . Probiotic Product (PROBIOTIC-10 PO)      No current facility-administered medications for this visit.    No Known Allergies  REVIEW OF SYSTEMS:   [X]  denotes positive finding, [ ]  denotes negative finding Cardiac  Comments:  Chest pain or chest pressure:    Shortness of breath upon exertion:    Short of breath when lying flat:    Irregular heart rhythm:        Vascular    Pain in calf, thigh, or hip  brought on by ambulation:    Pain in feet at night that wakes you up from your sleep:  x See HPI  Blood clot in your veins:    Leg swelling:         Pulmonary    Oxygen at home:    Productive cough:     Wheezing:         Neurologic    Sudden weakness in arms or legs:     Sudden numbness in arms or legs:     Sudden onset of difficulty speaking or slurred speech:    Temporary loss of vision in one eye:     Problems with dizziness:         Gastrointestinal    Blood in stool:     Vomited blood:         Genitourinary    Burning when urinating:     Blood in urine:        Psychiatric    Major depression:         Hematologic    Bleeding problems:    Problems with blood clotting too easily:        Skin    Rashes or ulcers:        Constitutional    Fever or chills:      PHYSICAL EXAMINATION:  Today's Vitals   12/25/20 1426  BP: 126/88  Pulse: 95  Resp: 20  Temp: 98.6 F (37 C)  TempSrc: Temporal  SpO2: 97%  Weight: 146 lb 9.6 oz (66.5 kg)  Height: 5\' 6"  (1.676 m)  PainSc: 4   PainLoc: Leg   Body mass index is 23.66 kg/m.   General:  WDWN in NAD; vital signs documented above Gait: Not observed HENT: WNL, normocephalic Pulmonary: normal non-labored breathing without wheezing Cardiac: regular HR; without carotid bruits Abdomen: soft, NT, no masses; aortic pulse is not palpable Skin: without rashes Vascular Exam/Pulses:  Right Left  Radial 2+ (normal) 2+ (normal)  DP 2+ (normal) 2+ (normal)  PT 1+ (weak) 1+ (weak)   Extremities: without ischemic changes, without cellulitis; without open wounds; without skin color changes  Right posterior thigh   Right ankle   Left posterior thigh    Musculoskeletal: no muscle wasting or atrophy  Neurologic: A&O X 3;  moving all extremities equally Psychiatric:  The pt has Normal affect.   Non-Invasive Vascular Imaging:   Venous duplex on 12/25/2020:  Venous Reflux Times   +--------------+---------+------+-----------+------------+------------+  RIGHT     Reflux NoRefluxReflux TimeDiameter cmsComments                 Yes                     +--------------+---------+------+-----------+------------+------------+  CFV      no                           +--------------+---------+------+-----------+------------+------------+  FV mid    no                           +--------------+---------+------+-----------+------------+------------+  Popliteal   no                           +--------------+---------+------+-----------+------------+------------+  GSV at [facility]  no               0.44          +--------------+---------+------+-----------+------------+------------+  GSV prox thigh      yes  >500 ms   0.41          +--------------+---------+------+-----------+------------+------------+  GSV mid thigh       yes  >500 ms   0.42          +--------------+---------+------+-----------+------------+------------+  GSV dist thigh      yes  >500 ms   0.36  large branch  +--------------+---------+------+-----------+------------+------------+  GSV at knee  no               0.30          +--------------+---------+------+-----------+------------+------------+  GSV prox calf no               0.28          +--------------+---------+------+-----------+------------+------------+  SSV Pop Fossa no               0.39          +--------------+---------+------+-----------+------------+------------+     +--------------+---------+------+-----------+------------+--------+  LEFT     Reflux NoRefluxReflux TimeDiameter cmsComments                Yes                   +--------------+---------+------+-----------+------------+--------+  CFV            yes  >1 second             +--------------+---------+------+-----------+------------+--------+  FV mid    no                         +--------------+---------+------+-----------+------------+--------+  GSV at [facility]  no               0.35        +--------------+---------+------+-----------+------------+--------+  GSV prox thighno               0.36        +--------------+---------+------+-----------+------------+--------+  GSV mid thigh no               0.34        +--------------+---------+------+-----------+------------+--------+  GSV dist thighno               0.38        +--------------+---------+------+-----------+------------+--------+  GSV at knee  no               0.41        +--------------+---------+------+-----------+------------+--------+  GSV prox calf no               0.31        +--------------+---------+------+-----------+------------+--------+  SSV Pop Fossa no               0.46        +--------------+---------+------+-----------+------------+--------+   Summary:  Right:  - No evidence of deep vein thrombosis from the common femoral through the popliteal veins.  - No evidence of superficial venous thrombosis.  - The deep venous system is competent.  - The great saphenous vein is not competent.  - The small saphenous vein is competent.    Left:  - No evidence of deep vein thrombosis from the common femoral through the popliteal veins.  - No evidence of superficial venous thrombosis.  - The common femoral vein is not competent.  - The great saphenous vein is competent.  - The small saphenous vein is  competent.    Valbona Slabach is a 40 y.o. female who presents with: varicose veins and family hx of varicose veins  Pt does not have any evidence of DVT in either leg.  On the right leg, there is some venous insufficiency present in the great saphenous vein, but does not qualify for laser ablation.  On the left leg, she does have some evidence of venous insufficiency on the common femoral vein.   -discussed with pt about wearing knee high 15-53mmHg compression stockings.  We also talked about avoiding prolonged sitting or standing and leg elevation.  She was given a handout.  -she does have some spider veins present and is interested in sclerotherapy.  Discussed with her that I would have our vein RN call her to discuss this with her.  She expressed understanding.  -she will f/u as needed.  She knows that if she has any issues in the future or any changes, she can follow up.    Doreatha Massed, [facility] Vascular [facility] Vein Specialists 12/25/2020 3:20 PM  Clinic MD:  Edilia Bo

## 2021-12-04 ENCOUNTER — Other Ambulatory Visit (HOSPITAL_COMMUNITY): Payer: Self-pay

## 2021-12-04 MED ORDER — ALPRAZOLAM 0.25 MG PO TABS
ORAL_TABLET | ORAL | 0 refills | Status: DC
  Filled 2021-12-04: qty 30, 10d supply, fill #0

## 2023-05-18 DIAGNOSIS — Z8742 Personal history of other diseases of the female genital tract: Secondary | ICD-10-CM | POA: Diagnosis not present

## 2023-05-18 DIAGNOSIS — Z124 Encounter for screening for malignant neoplasm of cervix: Secondary | ICD-10-CM | POA: Diagnosis not present

## 2023-05-18 DIAGNOSIS — R87611 Atypical squamous cells cannot exclude high grade squamous intraepithelial lesion on cytologic smear of cervix (ASC-H): Secondary | ICD-10-CM | POA: Diagnosis not present

## 2023-05-18 DIAGNOSIS — Z01419 Encounter for gynecological examination (general) (routine) without abnormal findings: Secondary | ICD-10-CM | POA: Diagnosis not present

## 2023-05-18 DIAGNOSIS — R102 Pelvic and perineal pain: Secondary | ICD-10-CM | POA: Diagnosis not present

## 2023-05-18 DIAGNOSIS — Z1151 Encounter for screening for human papillomavirus (HPV): Secondary | ICD-10-CM | POA: Diagnosis not present

## 2023-06-02 DIAGNOSIS — D259 Leiomyoma of uterus, unspecified: Secondary | ICD-10-CM | POA: Diagnosis not present

## 2023-06-02 DIAGNOSIS — R102 Pelvic and perineal pain: Secondary | ICD-10-CM | POA: Diagnosis not present

## 2023-06-02 DIAGNOSIS — R87613 High grade squamous intraepithelial lesion on cytologic smear of cervix (HGSIL): Secondary | ICD-10-CM | POA: Diagnosis not present

## 2023-06-02 DIAGNOSIS — E282 Polycystic ovarian syndrome: Secondary | ICD-10-CM | POA: Diagnosis not present

## 2023-06-28 DIAGNOSIS — R87810 Cervical high risk human papillomavirus (HPV) DNA test positive: Secondary | ICD-10-CM | POA: Diagnosis not present

## 2023-06-28 DIAGNOSIS — R87611 Atypical squamous cells cannot exclude high grade squamous intraepithelial lesion on cytologic smear of cervix (ASC-H): Secondary | ICD-10-CM | POA: Diagnosis not present

## 2023-06-28 DIAGNOSIS — Z3202 Encounter for pregnancy test, result negative: Secondary | ICD-10-CM | POA: Diagnosis not present

## 2023-06-28 DIAGNOSIS — D069 Carcinoma in situ of cervix, unspecified: Secondary | ICD-10-CM | POA: Diagnosis not present

## 2023-07-09 DIAGNOSIS — D069 Carcinoma in situ of cervix, unspecified: Secondary | ICD-10-CM | POA: Diagnosis not present

## 2023-07-21 NOTE — Patient Instructions (Signed)
SURGICAL WAITING ROOM VISITATION Patients having surgery or a procedure may have no more than 2 support people in the waiting area - these visitors may rotate.    Children under the age of 23 must have an adult with them who is not the patient.  If the patient needs to stay at the hospital during part of their recovery, the visitor guidelines for inpatient rooms apply. Pre-op nurse will coordinate an appropriate time for 1 support person to accompany patient in pre-op.  This support person may not rotate.    Please refer to the [facility] website for the visitor guidelines for Inpatients (after your surgery is over and you are in a regular room).       Your procedure is scheduled on: 07-28-23   Report to [facility] Main Entrance    Report to admitting at 1:15 PM   Call this number if you have problems the morning of surgery [phone]   Do not eat food :After Midnight.   After Midnight you may have the following liquids until 12:30 PM DAY OF SURGERY  Water Non-Citrus Juices (without pulp, NO RED-Apple, White grape, White cranberry) Black Coffee (NO MILK/CREAM OR CREAMERS, sugar ok)  Clear Tea (NO MILK/CREAM OR CREAMERS, sugar ok) regular and decaf                             Plain Jell-O (NO RED)                                           Fruit ices (not with fruit pulp, NO RED)                                     Popsicles (NO RED)                                                               Sports drinks like Gatorade (NO RED)                   The day of surgery:  Drink ONE (1) Pre-Surgery Clear Ensure by 12:30 PM the morning of surgery. Drink in one sitting. Do not sip.  This drink was given to you during your hospital  pre-op appointment visit. Nothing else to drink after completing the Pre-Surgery Clear Ensure.          If you have questions, please contact your surgeon's office.   FOLLOW ANY ADDITIONAL PRE OP INSTRUCTIONS YOU RECEIVED FROM YOUR SURGEON'S  OFFICE!!!     Oral Hygiene is also important to reduce your risk of infection.                                    Remember - BRUSH YOUR TEETH THE MORNING OF SURGERY WITH YOUR REGULAR TOOTHPASTE   Do NOT smoke after Midnight   Take these medicines the morning of surgery with A SIP OF WATER:   Okay to use Flonase nasal spray  Stop all vitamins and herbal supplements 7 days before surgery                              You may not have any metal on your body including hair pins, jewelry, and body piercing             Do not wear make-up, lotions, powders, perfumes or deodorant  Do not wear nail polish including gel and S&S, artificial/acrylic nails, or any other type of covering on natural nails including finger and toenails. If you have artificial nails, gel coating, etc. that needs to be removed by a nail salon please have this removed prior to surgery or surgery may need to be canceled/ delayed if the surgeon/ anesthesia feels like they are unable to be safely monitored.   Do not shave  48 hours prior to surgery.             Do not bring valuables to the hospital. [facility] IS NOT RESPONSIBLE   FOR VALUABLES.   Contacts, dentures or bridgework may not be worn into surgery.  DO NOT BRING YOUR HOME MEDICATIONS TO THE HOSPITAL. PHARMACY WILL DISPENSE MEDICATIONS LISTED ON YOUR MEDICATION LIST TO YOU DURING YOUR ADMISSION IN THE HOSPITAL!    Patients discharged on the day of surgery will not be allowed to drive home.  Someone NEEDS to stay with you for the first 24 hours after anesthesia.   Special Instructions: Bring a copy of your healthcare power of attorney and living will documents the day of surgery if you haven't scanned them before.              Please read over the following fact sheets you were given: IF YOU HAVE QUESTIONS ABOUT YOUR PRE-OP INSTRUCTIONS PLEASE CALL [phone] Gwen  If you received a COVID test during your pre-op visit  it is requested that you wear a mask  when out in public, stay away from anyone that may not be feeling well and notify your surgeon if you develop symptoms. If you test positive for Covid or have been in contact with anyone that has tested positive in the last 10 days please notify you surgeon.   - Preparing for Surgery Before surgery, you can play an important role.  Because skin is not sterile, your skin needs to be as free of germs as possible.  You can reduce the number of germs on your skin by washing with CHG (chlorahexidine gluconate) soap before surgery.  CHG is an antiseptic cleaner which kills germs and bonds with the skin to continue killing germs even after washing. Please DO NOT use if you have an allergy to CHG or antibacterial soaps.  If your skin becomes reddened/irritated stop using the CHG and inform your nurse when you arrive at Short Stay. Do not shave (including legs and underarms) for at least 48 hours prior to the first CHG shower.  You may shave your face/neck.  Please follow these instructions carefully:  1.  Shower with CHG Soap the night before surgery and the  morning of surgery.  2.  If you choose to wash your hair, wash your hair first as usual with your normal  shampoo.  3.  After you shampoo, rinse your hair and body thoroughly to remove the shampoo.  4.  Use CHG as you would any other liquid soap.  You can apply chg directly to the skin and wash.  Gently with a scrungie or clean washcloth.  5.  Apply the CHG Soap to your body ONLY FROM THE NECK DOWN.   Do   not use on face/ open                           Wound or open sores. Avoid contact with eyes, ears mouth and   genitals (private parts).                       Wash face,  Genitals (private parts) with your normal soap.             6.  Wash thoroughly, paying special attention to the area where your    surgery  will be performed.  7.  Thoroughly rinse your body with warm water from the neck down.  8.  DO NOT  shower/wash with your normal soap after using and rinsing off the CHG Soap.                9.  Pat yourself dry with a clean towel.            10.  Wear clean pajamas.            11.  Place clean sheets on your bed the night of your first shower and do not  sleep with pets. Day of Surgery : Do not apply any lotions/deodorants the morning of surgery.  Please wear clean clothes to the hospital/surgery center.  FAILURE TO FOLLOW THESE INSTRUCTIONS MAY RESULT IN THE CANCELLATION OF YOUR SURGERY  PATIENT SIGNATURE_________________________________  NURSE SIGNATURE__________________________________  ________________________________________________________________________

## 2023-07-21 NOTE — Progress Notes (Signed)
Surgery orders requested via Epic inbox. °

## 2023-07-21 NOTE — H&P (Signed)
Connie Turner is an 42 y.o. 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  CIN III was noted on ECC and cervical biopsy at 12 o'clock. Reports history of endometrial ablation. Considering definitive hysterectomy in the future as she does not desire future fertility (husband has had a vasectomy).  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 P16 is positive or immunohistochemical stain. Cervical biopsy, 11:00- Benign cervical tissue. P16 is pnegative on immunohistochemical stain.  Cervical biopsy, 1:00- Benign ectocervical tissue  09/24/2020 NILM/HRHPV + (Type 16 positive)  08/23/2019 NILM/HRHPV +  09/06/2014 NILM   TVUS (06/02/23): Uterus: 7.26 x 3.37 x 4.60 cm Endometrial thickness: 0.24 cm Fibroid 1 1.47 cm  Fibroid 2 0.68 cm  Fibroid 3 0.81 cmRight Ovary 5.04 cm  Left Ovary 4.48 cm Retroverted uterus. Small uterine fibroids- posterior fundal 1.5 x 1.4 x 0.9 cm, posterior 0.7 x 0.5 cm, anterior 0.8 x 0.8 cm.Endometrium WNL. Right ovary with multiple tiny follicles throughout ovary- ? PCOS. Left Ovary WNL. No adnexal masses seen.   Patient Active Problem List   Diagnosis Date Noted   CIN III (cervical intraepithelial neoplasia grade III) with severe dysplasia 07/25/2023    MEDICAL/FAMILY/SOCIAL HX: Patient's last menstrual period was 07/20/2023 (exact date).    Past Medical History:  Diagnosis Date   Abnormal uterine bleeding (AUB)    Anxiety    History of kidney stones    History of panic attacks    PONV  (postoperative nausea and vomiting)     Past Surgical History:  Procedure Laterality Date   DILITATION & CURRETTAGE/HYSTROSCOPY WITH HYDROTHERMAL ABLATION N/A 02/23/2020   Procedure: DILATATION & CURETTAGE/HYSTEROSCOPY WITH HYDROTHERMAL ABLATION AND POLYPECTOMY WITH MYOSURE;  Surgeon: Myna Hidalgo, DO;  Location: [facility];  Service: Gynecology;  Laterality: N/A;   TONSILLECTOMY  child   and tympanostomy tube placement   TYMPANOSTOMY TUBE PLACEMENT Bilateral child   WISDOM TOOTH EXTRACTION      Family History  Problem Relation Age of Onset   Varicose Veins Mother    Varicose Veins Maternal Grandmother     Social History:  reports that she quit smoking about 3 years ago. Her smoking use included cigarettes. She started smoking about 13 years ago. She has never used smokeless tobacco. She reports current alcohol use. She reports that she does not use drugs.  ALLERGIES/MEDS:  Allergies: No Known Allergies  No medications prior to admission.     Review of Systems  Constitutional: Negative.   HENT: Negative.    Eyes: Negative.   Respiratory: Negative.    Cardiovascular: Negative.   Gastrointestinal: Negative.   Genitourinary: Negative.   Musculoskeletal: Negative.   Skin: Negative.   Neurological: Negative.   Endo/Heme/Allergies: Negative.   Psychiatric/Behavioral: Negative.      Last menstrual period 07/20/2023. Gen:  NAD, pleasant and cooperative Cardio:  RRR Pulm:  CTAB, no wheezes/rales/rhonchi Abd:  Soft, non-distended, non-tender throughout, no rebound/guarding Ext:  No bilateral LE edema, no bilateral calf tenderness  No results found for this or any previous visit (from the past 24 hour(s)).  No results found.   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  - Admit to [facility] Main OR - Admit labs (CBC, T&S) - Diet:  Per anesthesia/ERAS pathway - IVF:  Per  anesthesia - VTE Prophylaxis:  SCDs - Antibiotics: None - D/C home same-day  Consents: The nature of the CKC is to surgically destroy or remove the abnormal areas, and to send a piece of the tissue to the lab for analysis.  The procedure is diagnostic, and usually therapeutic.  This procedure holds risks of infection, bleeding requiring a blood transfusion, incomplete resolution of the abnormality requiring a future procedure or additional follow-up testing, inability to carry a pregnancy (cervical insufficiency), cervical stenosis (narrowing), blood clot (DVT or PE), and ultimately a risk of death.  Patient understands these risks and agrees to proceed with the above named procedure. Patient was consented for blood products.  The patient is aware that bleeding may result in the need for a blood transfusion which includes risk of transmission of HIV (1:2 million), Hepatitis C (1:2 million), and Hepatitis B (1:200 thousand) and transfusion reaction.  Patient voiced understanding of the above risks as well as understanding of indications for blood transfusion.   Steva Ready, DO

## 2023-07-22 ENCOUNTER — Other Ambulatory Visit: Payer: Self-pay

## 2023-07-22 ENCOUNTER — Encounter (HOSPITAL_COMMUNITY): Payer: Self-pay

## 2023-07-22 ENCOUNTER — Encounter (HOSPITAL_COMMUNITY)
Admission: RE | Admit: 2023-07-22 | Discharge: 2023-07-22 | Disposition: A | Discharge status: Home / Self Care | Payer: 59 | Source: Ambulatory Visit | Attending: Obstetrics and Gynecology | Admitting: Obstetrics and Gynecology

## 2023-07-22 VITALS — BP 126/86 | HR 65 | Temp 98.6°F | Resp 16 | Ht 66.5 in | Wt 149.6 lb

## 2023-07-22 DIAGNOSIS — Z01812 Encounter for preprocedural laboratory examination: Secondary | ICD-10-CM | POA: Diagnosis not present

## 2023-07-22 DIAGNOSIS — Z01818 Encounter for other preprocedural examination: Secondary | ICD-10-CM

## 2023-07-22 DIAGNOSIS — D069 Carcinoma in situ of cervix, unspecified: Secondary | ICD-10-CM | POA: Insufficient documentation

## 2023-07-22 HISTORY — DX: Nausea with vomiting, unspecified: Z98.890

## 2023-07-22 HISTORY — DX: Personal history of urinary calculi: Z87.442

## 2023-07-22 LAB — CBC
HCT: 44.9 % (ref 36.0–46.0)
Hemoglobin: 15.1 g/dL — ABNORMAL HIGH (ref 12.0–15.0)
MCH: 31.2 pg (ref 26.0–34.0)
MCHC: 33.6 g/dL (ref 30.0–36.0)
MCV: 92.8 fL (ref 80.0–100.0)
Platelets: 346 10*3/uL (ref 150–400)
RBC: 4.84 MIL/uL (ref 3.87–5.11)
RDW: 11.9 % (ref 11.5–15.5)
WBC: 7.7 10*3/uL (ref 4.0–10.5)
nRBC: 0 % (ref 0.0–0.2)

## 2023-07-22 NOTE — Progress Notes (Signed)
COVID Vaccine Completed:  Date of COVID positive in last 90 days:  No  PCP - [facility] Cardiologist - N/A  Chest x-ray - N/A EKG - N/A Stress Test - N/A ECHO - N/A Cardiac Cath - N/A Pacemaker/ICD device last checked: Spinal Cord Stimulator:N/A  Bowel Prep - N/A  Sleep Study -  CPAP -   Fasting Blood Sugar - N/A Checks Blood Sugar _____ times a day  Last dose of GLP1 agonist-  N/A GLP1 instructions:  N/A   Last dose of SGLT-2 inhibitors-  N/A SGLT-2 instructions: N/A   Blood Thinner Instructions:  N/A Aspirin Instructions: Last Dose:  Activity level:  Can go up a flight of stairs and perform activities of daily living without stopping and without symptoms of chest pain or shortness of breath.  Able to exercise without symptoms   Anesthesia review: N/A  Patient denies shortness of breath, fever, cough and chest pain at PAT appointment  Patient verbalized understanding of instructions that were given to them at the PAT appointment. Patient was also instructed that they will need to review over the PAT instructions again at home before surgery.

## 2023-07-25 DIAGNOSIS — D069 Carcinoma in situ of cervix, unspecified: Secondary | ICD-10-CM

## 2023-07-27 NOTE — Progress Notes (Signed)
Pt aware of arrival to [facility] at 1045 for surgery and stopping liquids at 1000 am 07/28/23.

## 2023-07-28 ENCOUNTER — Encounter (HOSPITAL_COMMUNITY)
Admission: RE | Disposition: A | Discharge status: Home / Self Care | Payer: Self-pay | Source: Home / Self Care | Attending: Obstetrics and Gynecology

## 2023-07-28 ENCOUNTER — Ambulatory Visit (HOSPITAL_BASED_OUTPATIENT_CLINIC_OR_DEPARTMENT_OTHER): Payer: 59 | Admitting: Anesthesiology

## 2023-07-28 ENCOUNTER — Ambulatory Visit (HOSPITAL_COMMUNITY)
Admission: RE | Admit: 2023-07-28 | Discharge: 2023-07-28 | Disposition: A | Discharge status: Home / Self Care | Payer: 59 | Attending: Obstetrics and Gynecology | Admitting: Obstetrics and Gynecology

## 2023-07-28 ENCOUNTER — Other Ambulatory Visit: Payer: Self-pay

## 2023-07-28 ENCOUNTER — Encounter (HOSPITAL_COMMUNITY): Payer: Self-pay | Admitting: Obstetrics and Gynecology

## 2023-07-28 ENCOUNTER — Ambulatory Visit (HOSPITAL_COMMUNITY): Payer: 59 | Admitting: Anesthesiology

## 2023-07-28 DIAGNOSIS — N871 Moderate cervical dysplasia: Secondary | ICD-10-CM | POA: Diagnosis not present

## 2023-07-28 DIAGNOSIS — F419 Anxiety disorder, unspecified: Secondary | ICD-10-CM | POA: Diagnosis not present

## 2023-07-28 DIAGNOSIS — D069 Carcinoma in situ of cervix, unspecified: Secondary | ICD-10-CM | POA: Insufficient documentation

## 2023-07-28 DIAGNOSIS — Z87891 Personal history of nicotine dependence: Secondary | ICD-10-CM | POA: Insufficient documentation

## 2023-07-28 DIAGNOSIS — D060 Carcinoma in situ of endocervix: Secondary | ICD-10-CM | POA: Diagnosis not present

## 2023-07-28 DIAGNOSIS — Z01818 Encounter for other preprocedural examination: Secondary | ICD-10-CM

## 2023-07-28 HISTORY — PX: CERVICAL CONIZATION W/BX: SHX1330

## 2023-07-28 LAB — CBC
HCT: 43.7 % (ref 36.0–46.0)
Hemoglobin: 14.2 g/dL (ref 12.0–15.0)
MCH: 30.3 pg (ref 26.0–34.0)
MCHC: 32.5 g/dL (ref 30.0–36.0)
MCV: 93.2 fL (ref 80.0–100.0)
Platelets: 311 10*3/uL (ref 150–400)
RBC: 4.69 MIL/uL (ref 3.87–5.11)
RDW: 11.8 % (ref 11.5–15.5)
WBC: 7.6 10*3/uL (ref 4.0–10.5)
nRBC: 0 % (ref 0.0–0.2)

## 2023-07-28 LAB — TYPE AND SCREEN
ABO/RH(D): O POS
Antibody Screen: NEGATIVE

## 2023-07-28 LAB — POCT PREGNANCY, URINE: Preg Test, Ur: NEGATIVE

## 2023-07-28 SURGERY — CONE BIOPSY, CERVIX
Anesthesia: General

## 2023-07-28 MED ORDER — PROPOFOL 10 MG/ML IV BOLUS
INTRAVENOUS | Status: DC | PRN
  Administered 2023-07-28: 200 mg via INTRAVENOUS
  Administered 2023-07-28: 40 mg via INTRAVENOUS
  Administered 2023-07-28: 30 mg via INTRAVENOUS

## 2023-07-28 MED ORDER — SCOPOLAMINE 1 MG/3DAYS TD PT72
1.0000 | MEDICATED_PATCH | TRANSDERMAL | Status: DC
  Administered 2023-07-28: 1.5 mg via TRANSDERMAL
  Filled 2023-07-28: qty 1

## 2023-07-28 MED ORDER — DEXAMETHASONE SODIUM PHOSPHATE 10 MG/ML IJ SOLN
INTRAMUSCULAR | Status: DC | PRN
  Administered 2023-07-28: 10 mg via INTRAVENOUS

## 2023-07-28 MED ORDER — LIDOCAINE-EPINEPHRINE 1 %-1:100000 IJ SOLN
INTRAMUSCULAR | Status: AC
  Filled 2023-07-28: qty 2

## 2023-07-28 MED ORDER — ONDANSETRON HCL 4 MG/2ML IJ SOLN
INTRAMUSCULAR | Status: DC | PRN
  Administered 2023-07-28: 4 mg via INTRAVENOUS

## 2023-07-28 MED ORDER — PROPOFOL 500 MG/50ML IV EMUL
INTRAVENOUS | Status: DC | PRN
Start: 2023-07-28 — End: 2023-07-28
  Administered 2023-07-28: 200 ug/kg/min via INTRAVENOUS

## 2023-07-28 MED ORDER — MIDAZOLAM HCL 2 MG/2ML IJ SOLN
INTRAMUSCULAR | Status: AC
  Filled 2023-07-28: qty 2

## 2023-07-28 MED ORDER — FENTANYL CITRATE (PF) 100 MCG/2ML IJ SOLN
INTRAMUSCULAR | Status: AC
  Filled 2023-07-28: qty 2

## 2023-07-28 MED ORDER — ONDANSETRON HCL 4 MG/2ML IJ SOLN
INTRAMUSCULAR | Status: AC
  Filled 2023-07-28: qty 2

## 2023-07-28 MED ORDER — MIDAZOLAM HCL 5 MG/5ML IJ SOLN
INTRAMUSCULAR | Status: DC | PRN
  Administered 2023-07-28: 2 mg via INTRAVENOUS

## 2023-07-28 MED ORDER — FENTANYL CITRATE (PF) 100 MCG/2ML IJ SOLN
INTRAMUSCULAR | Status: DC | PRN
  Administered 2023-07-28 (×2): 50 ug via INTRAVENOUS

## 2023-07-28 MED ORDER — LIDOCAINE-EPINEPHRINE 1 %-1:100000 IJ SOLN
INTRAMUSCULAR | Status: DC | PRN
  Administered 2023-07-28: 12 mL

## 2023-07-28 MED ORDER — DEXAMETHASONE SODIUM PHOSPHATE 10 MG/ML IJ SOLN
INTRAMUSCULAR | Status: AC
  Filled 2023-07-28: qty 1

## 2023-07-28 MED ORDER — LIDOCAINE 2% (20 MG/ML) 5 ML SYRINGE
INTRAMUSCULAR | Status: DC | PRN
  Administered 2023-07-28: 60 mg via INTRAVENOUS

## 2023-07-28 MED ORDER — PROPOFOL 10 MG/ML IV BOLUS
INTRAVENOUS | Status: AC
  Filled 2023-07-28: qty 20

## 2023-07-28 MED ORDER — KETOROLAC TROMETHAMINE 30 MG/ML IJ SOLN
INTRAMUSCULAR | Status: DC | PRN
  Administered 2023-07-28: 30 mg via INTRAVENOUS

## 2023-07-28 MED ORDER — MONSELS FERRIC SUBSULFATE EX SOLN
CUTANEOUS | Status: AC
  Filled 2023-07-28: qty 8

## 2023-07-28 MED ORDER — ACETAMINOPHEN 500 MG PO TABS
1000.0000 mg | ORAL_TABLET | Freq: Once | ORAL | Status: AC
  Administered 2023-07-28: 1000 mg via ORAL
  Filled 2023-07-28: qty 2

## 2023-07-28 MED ORDER — LIDOCAINE HCL (PF) 2 % IJ SOLN
INTRAMUSCULAR | Status: AC
  Filled 2023-07-28: qty 5

## 2023-07-28 MED ORDER — MONSELS FERRIC SUBSULFATE EX SOLN
CUTANEOUS | Status: DC | PRN
  Administered 2023-07-28: 1 via TOPICAL

## 2023-07-28 MED ORDER — ORAL CARE MOUTH RINSE: 15.0000 mL | Freq: Once | OROMUCOSAL | Status: AC

## 2023-07-28 MED ORDER — KETOROLAC TROMETHAMINE 30 MG/ML IJ SOLN
INTRAMUSCULAR | Status: AC
  Filled 2023-07-28: qty 1

## 2023-07-28 MED ORDER — FENTANYL CITRATE PF 50 MCG/ML IJ SOSY: 25.0000 ug | PREFILLED_SYRINGE | INTRAMUSCULAR | Status: DC | PRN

## 2023-07-28 MED ORDER — IBUPROFEN 800 MG PO TABS: 800.0000 mg | ORAL_TABLET | Freq: Three times a day (TID) | ORAL | 1 refills | Status: DC | PRN

## 2023-07-28 MED ORDER — LACTATED RINGERS IV SOLN: INTRAVENOUS | Status: DC

## 2023-07-28 MED ORDER — HEMOSTATIC AGENTS (NO CHARGE) OPTIME
TOPICAL | Status: DC | PRN
Start: 2023-07-28 — End: 2023-07-28
  Administered 2023-07-28: 1

## 2023-07-28 MED ORDER — CHLORHEXIDINE GLUCONATE 0.12 % MT SOLN
15.0000 mL | Freq: Once | OROMUCOSAL | Status: AC
  Administered 2023-07-28: 15 mL via OROMUCOSAL

## 2023-07-28 SURGICAL SUPPLY — 19 items
BLADE SURG 11 STRL SS (BLADE) ×1 IMPLANT
CATH ROBINSON RED A/P 16FR (CATHETERS) ×1 IMPLANT
ELECT BALL LEEP 5MM RED (ELECTRODE) IMPLANT
GAUZE 4X4 16PLY ~~LOC~~+RFID DBL (SPONGE) IMPLANT
GLOVE BIO SURGEON STRL SZ 6.5 (GLOVE) ×1 IMPLANT
GLOVE BIOGEL PI IND STRL 6.5 (GLOVE) ×1 IMPLANT
GOWN STRL REUS W/TWL LRG LVL3 (GOWN DISPOSABLE) ×1 IMPLANT
HEMOSTAT SURGICEL 2X3 (HEMOSTASIS) IMPLANT
HEMOSTAT SURGICEL 4X8 (HEMOSTASIS) IMPLANT
KIT TURNOVER CYSTO (KITS) ×1 IMPLANT
NS IRRIG 1000ML POUR BTL (IV SOLUTION) ×1 IMPLANT
PACK VAGINAL WOMENS (CUSTOM PROCEDURE TRAY) ×1 IMPLANT
PAD OB MATERNITY 4.3X12.25 (PERSONAL CARE ITEMS) ×1 IMPLANT
SCOPETTES 8 STERILE (MISCELLANEOUS) ×1 IMPLANT
SLEEVE SCD COMPRESS KNEE MED (STOCKING) ×1 IMPLANT
SPONGE SURGIFOAM ABS GEL 12-7 (HEMOSTASIS) IMPLANT
SUT SILK 2 0 SH (SUTURE) IMPLANT
SUT VIC AB 0 CT1 27 (SUTURE) ×3
SUT VIC AB 0 CT1 27XBRD ANBCTR (SUTURE) ×3 IMPLANT

## 2023-07-28 NOTE — Interval H&P Note (Signed)
History and Physical Interval Note:  07/28/2023 12:54 PM  Connie Turner  has presented today for surgery, with the diagnosis of Cervical Intraepithelial Neoplasia III.  The various methods of treatment have been discussed with the patient and family. After consideration of risks, benefits and other options for treatment, the patient has consented to  Procedure(s): COLD KNIFE CONIZATION CERVIX (N/A) as a surgical intervention.  The patient's history has been reviewed, patient examined, no change in status, stable for surgery.  I have reviewed the patient's chart and labs.  Questions were answered to the patient's satisfaction.     Steva Ready

## 2023-07-28 NOTE — Op Note (Signed)
Pre Op Dx:   High grade cervical dysplasia (CIN III)  Post Op Dx:   Same as pre-operative diagnoses  Procedure:   1. Cold knife conization of the cervix 2. Endocervical curettage  Surgeon:  Dr. Katrinka Blazing. Connye Burkitt Assistants:  None Anesthesia:  LMA  EBL:  50cc  IVF:  600cc UOP:  Voided prior to arrival to OR  Drains:  None Specimen removed:  Cervical cone biopsy (tagged at 12 o'clock) - sent to pathology  Endocervical curettings - sent to cytology Device(s) implanted:  None Case Type:  Clean-contaminated Findings: Normal-appearing external genitalia and normal-appearing multiparous cervix. Complications: None Indications:  42 y.o. yo patient with abnormal Pap, cervical biopsy showing CIN III at 12 o'clock and CIN III endocervical curettings.  Description of Procedure:  After informed consent was obtained the patient was brought to the operating room.  She was placed in dorsal supine position and anesthesia was administered.  She was placed in dorsal lithotomy position and prepped and draped in the usual sterile fashion.  A preoperative timeout was called. A paracervical block was performed using 10cc of 1% Lidocaine with epinephrine. A cervical block was performed using 6cc of 1% Lidocaine with epinephrine.  Two 0-Vicryl sutures were placed at 3 and 9 o'clock on the cervix for traction and hemostasis. A circumferential cervical incision was initiated and a traction sutures was placed in the specimen, tagging the specimen at 12 o'clock.  The cone biopsy was completed sharply with scissors and passed off the field.  The bed of the cone biopsy was made hemostatic with electrosurgery. Monsel's was painted over the cervical wound bed.  A piece of Surgicel was then tied in place over the cone bed for additional hemostasis.  She was returned to dorsal supine position, awakened and extubated having appeared to tolerate the procedure well.  All counts were correct.  She was transferred to PACU in  good condition.    Disposition: PACU  Steva Ready, DO

## 2023-07-28 NOTE — Anesthesia Preprocedure Evaluation (Addendum)
Anesthesia Evaluation  Patient identified by MRN, date of birth, ID band Patient awake    Reviewed: Allergy & Precautions, NPO status , Patient's Chart, lab work & pertinent test results  History of Anesthesia Complications (+) PONV and history of anesthetic complications  Airway Mallampati: III  TM Distance: >3 FB Neck ROM: Full    Dental no notable dental hx. (+) Teeth Intact, Dental Advisory Given   Pulmonary former smoker   Pulmonary exam normal breath sounds clear to auscultation       Cardiovascular negative cardio ROS Normal cardiovascular exam Rhythm:Regular Rate:Normal     Neuro/Psych  PSYCHIATRIC DISORDERS Anxiety     negative neurological ROS     GI/Hepatic negative GI ROS, Neg liver ROS,,,  Endo/Other  negative endocrine ROS    Renal/GU negative Renal ROS  negative genitourinary   Musculoskeletal negative musculoskeletal ROS (+)    Abdominal   Peds  Hematology negative hematology ROS (+)   Anesthesia Other Findings CIN III  Reproductive/Obstetrics                             Anesthesia Physical Anesthesia Plan  ASA: 2  Anesthesia Plan: General   Post-op Pain Management: Tylenol PO (pre-op)*   Induction: Intravenous  PONV Risk Score and Plan: 4 or greater and Ondansetron, Dexamethasone, Midazolam, Treatment may vary due to age or medical condition, Scopolamine patch - Pre-op and TIVA  Airway Management Planned: LMA  Additional Equipment:   Intra-op Plan:   Post-operative Plan: Extubation in OR  Informed Consent: I have reviewed the patients History and Physical, chart, labs and discussed the procedure including the risks, benefits and alternatives for the proposed anesthesia with the patient or authorized representative who has indicated his/her understanding and acceptance.     Dental advisory given  Plan Discussed with: CRNA  Anesthesia Plan Comments:         Anesthesia Quick Evaluation

## 2023-07-28 NOTE — Transfer of Care (Signed)
Immediate Anesthesia Transfer of Care Note  Patient: Connie Turner  Procedure(s) Performed: COLD KNIFE CONIZATION CERVIX  Patient Location: PACU  Anesthesia Type:General  Level of Consciousness: awake, alert , oriented, and patient cooperative  Airway & Oxygen Therapy: Patient Spontanous Breathing  Post-op Assessment: Report given to RN and Post -op Vital signs reviewed and stable  Post vital signs: Reviewed and stable  Last Vitals:  Vitals Value Taken Time  BP 113/73 07/28/23 1415  Temp    Pulse 77 07/28/23 1416  Resp 14 07/28/23 1416  SpO2 97 % 07/28/23 1416  Vitals shown include unfiled device data.  Last Pain:  Vitals:   07/28/23 1106  TempSrc:   PainSc: 0-No pain      Patients Stated Pain Goal: 3 (07/28/23 1106)  Complications: No notable events documented.

## 2023-07-28 NOTE — Anesthesia Procedure Notes (Signed)
Procedure Name: LMA Insertion Date/Time: 07/28/2023 1:21 PM  Performed by: Bishop Limbo, CRNAPre-anesthesia Checklist: Patient identified, Emergency Drugs available, Suction available and Patient being monitored Patient Re-evaluated:Patient Re-evaluated prior to induction Oxygen Delivery Method: Circle System Utilized Preoxygenation: Pre-oxygenation with 100% oxygen Induction Type: IV induction Ventilation: Mask ventilation without difficulty LMA: LMA inserted LMA Size: 4.0 Number of attempts: 1 Placement Confirmation: positive ETCO2 Tube secured with: Tape Dental Injury: Teeth and Oropharynx as per pre-operative assessment

## 2023-07-29 ENCOUNTER — Encounter (HOSPITAL_COMMUNITY): Payer: Self-pay | Admitting: Obstetrics and Gynecology

## 2023-07-30 NOTE — Anesthesia Postprocedure Evaluation (Signed)
Anesthesia Post Note  Patient: Connie Turner  Procedure(s) Performed: COLD KNIFE CONIZATION CERVIX     Patient location during evaluation: PACU Anesthesia Type: General Level of consciousness: awake and alert Pain management: pain level controlled Vital Signs Assessment: post-procedure vital signs reviewed and stable Respiratory status: spontaneous breathing, nonlabored ventilation, respiratory function stable and patient connected to nasal cannula oxygen Cardiovascular status: blood pressure returned to baseline and stable Postop Assessment: no apparent nausea or vomiting Anesthetic complications: no  No notable events documented.  Last Vitals:  Vitals:   07/28/23 1445 07/28/23 1514  BP: 111/68 (!) 129/92  Pulse: (!) 42 66  Resp: 17 17  Temp: (!) 36.4 C 36.4 C  SpO2: 99% 100%    Last Pain:  Vitals:   07/28/23 1514  TempSrc: Oral  PainSc: 0-No pain                 Melanie Openshaw L Egor Fullilove

## 2023-08-02 LAB — SURGICAL PATHOLOGY

## 2023-08-03 LAB — CYTOLOGY - NON PAP

## 2023-09-18 NOTE — H&P (Signed)
Tim Sevy is an 42 y.o. G2P2 who is admitted for Robotic Assisted Total Laparoscopic Hysterectomy and Bilateral Salpingectomy due to recent history of CIN III.  She is s/p CKC on 07/21/2023 and pathology revealed CIN II-III with negative margins. Patient does not desire future fertility (husband has had vasectomy). Patient desires definitive hysterectomy for management as she does not desire any future colposcopies of the cervix or possibilty of another diagnostic excisional procedure. She understands that she will still need vaginal cuff pap smears.  CKC (07/21/2023):  FINAL MICROSCOPIC DIAGNOSIS:   A. CERVIX, CONE:  -  Focal minimal residual high-grade squamous intraepithelial  neoplasia/cervical intraepithelial neoplasia 2 of 3 (HGSIL/CIN-2).  -  Margins negative.  -  Immunohistochemical stains for p16 were performed and show focal  blocklike positivity supporting the diagnosis.   Clinical History: Cervical intraepithelial neoplasia III  Specimen Submitted:  A. ENDOCERVIX, CURETTAGE:    FINAL MICROSCOPIC DIAGNOSIS:  - Benign reactive/reparative changes   SPECIMEN ADEQUACY:  Satisfactory for evaluation   DIAGNOSTIC COMMENTS:  Benign endometrium/endometrial cells and endocervical cells present;  negative for atypia.   Pap/Colposcopy History: 06/28/2023 Colposcopy: Endocervical Curettings- High grade squamous intraepithelial lesion (CIN III)  Cervical biopsy, 12:00- High grade squamous intraepithelial lesion (CIN III)  Cervical biopsy, 11:00- Benign ectocervical tissue  Cervical biopsy, 3:00- Benign ectocervical tissue   05/18/2023 ASC-H/HRHPV +   09/29/2021 NILM/HRHPV + (Type 16 positive)   10/24/2021 Colposcopy: Endocervical Curettings- Low grade squamous intraepithelial lesion (CIN I). P16 is positive or immunohistochemical stain. Cervical biopsy, 11:00- Benign cervical tissue. P16 is pnegative on immunohistochemical stain.  Cervical biopsy, 1:00- Benign ectocervical  tissue   09/24/2020 NILM/HRHPV + (Type 16 positive)   08/23/2019 NILM/HRHPV +   09/06/2014 NILM     TVUS (06/02/23): Uterus: 7.26 x 3.37 x 4.60 cm Endometrial thickness: 0.24 cm Fibroid 1 1.47 cm  Fibroid 2 0.68 cm  Fibroid 3 0.81 cmRight Ovary 5.04 cm  Left Ovary 4.48 cm Retroverted uterus. Small uterine fibroids- posterior fundal 1.5 x 1.4 x 0.9 cm, posterior 0.7 x 0.5 cm, anterior 0.8 x 0.8 cm.Endometrium WNL. Right ovary with multiple tiny follicles throughout ovary- ? PCOS. Left Ovary WNL. No adnexal masses seen.  Patient Active Problem List   Diagnosis Date Noted   CIN III (cervical intraepithelial neoplasia grade III) with severe dysplasia 07/25/2023   MEDICAL/FAMILY/SOCIAL HX: No LMP recorded.    Past Medical History:  Diagnosis Date   Abnormal uterine bleeding (AUB)    Anxiety    History of kidney stones    History of panic attacks    PONV (postoperative nausea and vomiting)     Past Surgical History:  Procedure Laterality Date   CERVICAL CONIZATION W/BX N/A 07/28/2023   Procedure: COLD KNIFE CONIZATION CERVIX;  Surgeon: Steva Ready, DO;  Location: [facility];  Service: Gynecology;  Laterality: N/A;   DILITATION & CURRETTAGE/HYSTROSCOPY WITH HYDROTHERMAL ABLATION N/A 02/23/2020   Procedure: DILATATION & CURETTAGE/HYSTEROSCOPY WITH HYDROTHERMAL ABLATION AND POLYPECTOMY WITH MYOSURE;  Surgeon: Myna Hidalgo, DO;  Location: [facility];  Service: Gynecology;  Laterality: N/A;   TONSILLECTOMY  child   and tympanostomy tube placement   TYMPANOSTOMY TUBE PLACEMENT Bilateral child   WISDOM TOOTH EXTRACTION      Family History  Problem Relation Age of Onset   Varicose Veins Mother    Varicose Veins Maternal Grandmother     Social History:  reports that she quit smoking about 3 years ago. Her smoking use included cigarettes. She started smoking  about 13 years ago. She has never used smokeless tobacco. She reports current alcohol use. She reports that  she does not use drugs.  ALLERGIES/MEDS:  Allergies: No Known Allergies  No medications prior to admission.     Review of Systems  Constitutional: Negative.   HENT: Negative.    Eyes: Negative.   Respiratory: Negative.    Cardiovascular: Negative.   Gastrointestinal: Negative.   Genitourinary: Negative.   Musculoskeletal: Negative.   Skin: Negative.   Neurological: Negative.   Endo/Heme/Allergies: Negative.   Psychiatric/Behavioral: Negative.      There were no vitals taken for this visit. Gen:  NAD, pleasant and cooperative Cardio:  RRR Pulm:  CTAB, no wheezes/rales/rhonchi Abd:  Soft, non-distended, non-tender throughout, no rebound/guarding Ext:  No bilateral LE edema, no bilateral calf tenderness Pelvic: ***  No results found for this or any previous visit (from the past 24 hour(s)).  No results found.   ASSESSMENT/PLAN: Astria Madron is a 42 y.o. G2P2 who is admitted for Robotic Assisted Total Laparoscopic Hysterectomy and Bilateral Salpingectomy due to recent history of CIN III.  - Admit to [facility] Main OR - Admit labs (CBC, T&S, urine HCG) - Diet:  Per anesthesia/ERAS pathway - IVF:  Per anesthesia - VTE Prophylaxis:  SCDs - Antibiotics: Ancef 2g on call to OR - D/C home same-day or POD#1  Consents: I have explained to the patient that his surgery is performed to remove the uterus through several small incisions in the abdomen and that it will result in sterility.  I discussed the risks and benefits of the surgery, including, but not limited to bleeding, including the need for blood transfusion, infection, damage to surround organs and tissues, damage to bladder, damage to ureters, causing kidney damage, and requiring additional procedures, damage to bowels, resulting in further surgery, postoperative pain, short-term and long-term, scarring on the abdominal wall and intra-abdominally, need for further surgery, need for conversion to an open procedure,  development of an incisional hernia, deep vein thrombosis, and/or pulmonary embolism, wound infection and/or separation, painful intercourse, urinary leakage, ovarian failure, resulting in menopausal symptoms requiring treatment, fistula formation, complications the course of which cannot be predicted or prevented, and death. Patient was consented for blood products.  The patient is aware that bleeding may result in the need for a blood transfusion which includes risk of transmission of HIV (1:2 million), Hepatitis C (1:2 million), and Hepatitis B (1:200 thousand) and transfusion reaction.  Patient voiced understanding of the above risks as well as understanding of indications for blood transfusion.   Steva Ready, DO

## 2023-09-21 DIAGNOSIS — Z01818 Encounter for other preprocedural examination: Secondary | ICD-10-CM | POA: Diagnosis not present

## 2023-09-21 DIAGNOSIS — D069 Carcinoma in situ of cervix, unspecified: Secondary | ICD-10-CM | POA: Diagnosis not present

## 2023-09-21 NOTE — Progress Notes (Signed)
Surgical Instructions   Your procedure is scheduled on Wednesday, November 20th, 2024. Report to [facility] Main Entrance "A" at 6:30 A.M., then check in with the Admitting office. Any questions or running late day of surgery: call [phone]  Questions prior to your surgery date: call [phone], Monday-Friday, 8am-4pm. If you experience any cold or flu symptoms such as cough, fever, chills, shortness of breath, etc. between now and your scheduled surgery, please notify us at the above number.     Remember:  Do not eat after midnight the night before your surgery   You may drink clear liquids until 5:30 the morning of your surgery.   Clear liquids allowed are: Water, Non-Citrus Juices (without pulp), Carbonated Beverages, Clear Tea (no milk, honey, etc.), Black Coffee Only (NO MILK, CREAM OR POWDERED CREAMER of any kind), and Gatorade.    Take these medicines the morning of surgery with A SIP OF WATER: None   May take these medicines IF NEEDED:       Flonase Nasal Spray    One week prior to surgery, STOP taking any Aspirin (unless otherwise instructed by your surgeon) Aleve, Naproxen, Ibuprofen, Motrin, Advil, Goody's, BC's, all herbal medications, fish oil, and non-prescription vitamins.                     Do NOT Smoke (Tobacco/Vaping) for 24 hours prior to your procedure.  If you use a CPAP at night, you may bring your mask/headgear for your overnight stay.   You will be asked to remove any contacts, glasses, piercing's, hearing aid's, dentures/partials prior to surgery. Please bring cases for these items if needed.    Patients discharged the day of surgery will not be allowed to drive home, and someone needs to stay with them for 24 hours.  SURGICAL WAITING ROOM VISITATION Patients may have no more than 2 support people in the waiting area - these visitors may rotate.   Pre-op nurse will coordinate an appropriate time for 1 ADULT support person, who may not rotate, to  accompany patient in pre-op.  Children under the age of 73 must have an adult with them who is not the patient and must remain in the main waiting area with an adult.  If the patient needs to stay at the hospital during part of their recovery, the visitor guidelines for inpatient rooms apply.  Please refer to the [facility] website for the visitor guidelines for any additional information.   If you received a COVID test during your pre-op visit  it is requested that you wear a mask when out in public, stay away from anyone that may not be feeling well and notify your surgeon if you develop symptoms. If you have been in contact with anyone that has tested positive in the last 10 days please notify you surgeon.      Pre-operative CHG Bathing Instructions   You can play a key role in reducing the risk of infection after surgery. Your skin needs to be as free of germs as possible. You can reduce the number of germs on your skin by washing with CHG (chlorhexidine gluconate) soap before surgery. CHG is an antiseptic soap that kills germs and continues to kill germs even after washing.   DO NOT use if you have an allergy to chlorhexidine/CHG or antibacterial soaps. If your skin becomes reddened or irritated, stop using the CHG and notify one of our RNs at [phone].  TAKE A SHOWER THE NIGHT BEFORE SURGERY AND THE DAY OF SURGERY    Please keep in mind the following:  DO NOT shave, including legs and underarms, 48 hours prior to surgery.   You may shave your face before/day of surgery.  Place clean sheets on your bed the night before surgery Use a clean washcloth (not used since being washed) for each shower. DO NOT sleep with pet's night before surgery.  CHG Shower Instructions:  Wash your face and private area with normal soap. If you choose to wash your hair, wash first with your normal shampoo.  After you use shampoo/soap, rinse your hair and body thoroughly to remove  shampoo/soap residue.  Turn the water OFF and apply half the bottle of CHG soap to a CLEAN washcloth.  Apply CHG soap ONLY FROM YOUR NECK DOWN TO YOUR TOES (washing for 3-5 minutes)  DO NOT use CHG soap on face, private areas, open wounds, or sores.  Pay special attention to the area where your surgery is being performed.  If you are having back surgery, having someone wash your back for you may be helpful. Wait 2 minutes after CHG soap is applied, then you may rinse off the CHG soap.  Pat dry with a clean towel  Put on clean pajamas    Additional instructions for the day of surgery: DO NOT APPLY any lotions, deodorants, cologne, or perfumes.   Do not wear jewelry or makeup Do not wear nail polish, gel polish, artificial nails, or any other type of covering on natural nails (fingers and toes) Do not bring valuables to the hospital. [facility] is not responsible for valuables/personal belongings. Put on clean/comfortable clothes.  Please brush your teeth.  Ask your nurse before applying any prescription medications to the skin.

## 2023-09-22 ENCOUNTER — Encounter (HOSPITAL_COMMUNITY)
Admission: RE | Admit: 2023-09-22 | Discharge: 2023-09-22 | Disposition: A | Discharge status: Home / Self Care | Payer: 59 | Source: Ambulatory Visit | Attending: Obstetrics and Gynecology | Admitting: Obstetrics and Gynecology

## 2023-09-22 ENCOUNTER — Other Ambulatory Visit: Payer: Self-pay

## 2023-09-22 ENCOUNTER — Encounter (HOSPITAL_COMMUNITY): Payer: Self-pay

## 2023-09-22 DIAGNOSIS — Z01818 Encounter for other preprocedural examination: Secondary | ICD-10-CM

## 2023-09-22 DIAGNOSIS — Z01812 Encounter for preprocedural laboratory examination: Secondary | ICD-10-CM | POA: Diagnosis not present

## 2023-09-22 DIAGNOSIS — D069 Carcinoma in situ of cervix, unspecified: Secondary | ICD-10-CM | POA: Insufficient documentation

## 2023-09-22 LAB — CBC
HCT: 43.7 % (ref 36.0–46.0)
Hemoglobin: 14.6 g/dL (ref 12.0–15.0)
MCH: 30.7 pg (ref 26.0–34.0)
MCHC: 33.4 g/dL (ref 30.0–36.0)
MCV: 91.8 fL (ref 80.0–100.0)
Platelets: 285 10*3/uL (ref 150–400)
RBC: 4.76 MIL/uL (ref 3.87–5.11)
RDW: 12.6 % (ref 11.5–15.5)
WBC: 8.3 10*3/uL (ref 4.0–10.5)
nRBC: 0 % (ref 0.0–0.2)

## 2023-09-22 LAB — TYPE AND SCREEN
ABO/RH(D): O POS
Antibody Screen: NEGATIVE

## 2023-09-22 NOTE — Progress Notes (Signed)
PCP - Seen at [facility]- no certain MD  Cardiologist - no  EP-no  Endocrine-no  Pulm-no  Chest x-ray - no  EKG - na  Stress Test - no  ECHO - no  Cardiac Cath - no  AICD-no PM-no LOOP-no  Nerve Stimulator-no  Dialysis-no  Sleep Study - no CPAP - no  LABS-CBC, T/S  ASA- no  ERAS-yes  HA1C- GLP-1-na Fasting Blood Sugar - na Checks Blood Sugar _na____ times a day  Anesthesia-  Pt denies having chest pain, sob, or fever at this time. All instructions explained to the pt, with a verbal understanding of the material. Pt agrees to go over the instructions while at home for a better understanding. The opportunity to ask questions was provided.

## 2023-09-28 NOTE — Anesthesia Preprocedure Evaluation (Signed)
Anesthesia Evaluation  Patient identified by MRN, date of birth, ID band Patient awake    Reviewed: Allergy & Precautions, NPO status , Patient's Chart, lab work & pertinent test results  History of Anesthesia Complications (+) PONV and history of anesthetic complications  Airway Mallampati: II  TM Distance: >3 FB Neck ROM: Full    Dental no notable dental hx.    Pulmonary former smoker   Pulmonary exam normal        Cardiovascular negative cardio ROS Normal cardiovascular exam     Neuro/Psych   Anxiety     negative neurological ROS     GI/Hepatic negative GI ROS, Neg liver ROS,,,  Endo/Other  negative endocrine ROS    Renal/GU negative Renal ROS  negative genitourinary   Musculoskeletal negative musculoskeletal ROS (+)    Abdominal   Peds  Hematology negative hematology ROS (+)   Anesthesia Other Findings Day of surgery medications reviewed with patient.  Reproductive/Obstetrics AUB                              Anesthesia Physical Anesthesia Plan  ASA: 1  Anesthesia Plan: General   Post-op Pain Management: Tylenol PO (pre-op)* and Toradol IV (intra-op)*   Induction: Intravenous  PONV Risk Score and Plan: 4 or greater and Scopolamine patch - Pre-op, Midazolam, Treatment may vary due to age or medical condition, Propofol infusion, TIVA, Dexamethasone and Ondansetron  Airway Management Planned: Oral ETT  Additional Equipment: None  Intra-op Plan:   Post-operative Plan: Extubation in OR  Informed Consent: I have reviewed the patients History and Physical, chart, labs and discussed the procedure including the risks, benefits and alternatives for the proposed anesthesia with the patient or authorized representative who has indicated his/her understanding and acceptance.     Dental advisory given  Plan Discussed with: CRNA  Anesthesia Plan Comments:          Anesthesia Quick Evaluation

## 2023-09-29 ENCOUNTER — Ambulatory Visit (HOSPITAL_COMMUNITY): Payer: 59 | Admitting: Anesthesiology

## 2023-09-29 ENCOUNTER — Other Ambulatory Visit (HOSPITAL_COMMUNITY): Payer: Self-pay

## 2023-09-29 ENCOUNTER — Ambulatory Visit (HOSPITAL_BASED_OUTPATIENT_CLINIC_OR_DEPARTMENT_OTHER): Payer: Self-pay | Admitting: Anesthesiology

## 2023-09-29 ENCOUNTER — Encounter (HOSPITAL_COMMUNITY)
Admission: RE | Disposition: A | Discharge status: Home / Self Care | Payer: Self-pay | Source: Home / Self Care | Attending: Obstetrics and Gynecology

## 2023-09-29 ENCOUNTER — Other Ambulatory Visit: Payer: Self-pay

## 2023-09-29 ENCOUNTER — Ambulatory Visit (HOSPITAL_COMMUNITY)
Admission: RE | Admit: 2023-09-29 | Discharge: 2023-09-29 | Disposition: A | Discharge status: Home / Self Care | Payer: 59 | Attending: Obstetrics and Gynecology | Admitting: Obstetrics and Gynecology

## 2023-09-29 ENCOUNTER — Encounter (HOSPITAL_COMMUNITY): Payer: Self-pay | Admitting: Obstetrics and Gynecology

## 2023-09-29 DIAGNOSIS — D069 Carcinoma in situ of cervix, unspecified: Secondary | ICD-10-CM | POA: Insufficient documentation

## 2023-09-29 DIAGNOSIS — N83202 Unspecified ovarian cyst, left side: Secondary | ICD-10-CM | POA: Insufficient documentation

## 2023-09-29 DIAGNOSIS — Z01818 Encounter for other preprocedural examination: Secondary | ICD-10-CM

## 2023-09-29 DIAGNOSIS — N871 Moderate cervical dysplasia: Secondary | ICD-10-CM | POA: Diagnosis not present

## 2023-09-29 DIAGNOSIS — D259 Leiomyoma of uterus, unspecified: Secondary | ICD-10-CM | POA: Diagnosis not present

## 2023-09-29 DIAGNOSIS — D251 Intramural leiomyoma of uterus: Secondary | ICD-10-CM | POA: Diagnosis not present

## 2023-09-29 DIAGNOSIS — N8312 Corpus luteum cyst of left ovary: Secondary | ICD-10-CM | POA: Diagnosis not present

## 2023-09-29 HISTORY — PX: ROBOTIC ASSISTED LAPAROSCOPIC HYSTERECTOMY AND SALPINGECTOMY: SHX6379

## 2023-09-29 LAB — POCT PREGNANCY, URINE: Preg Test, Ur: NEGATIVE

## 2023-09-29 SURGERY — XI ROBOTIC ASSISTED LAPAROSCOPIC HYSTERECTOMY AND SALPINGECTOMY
Anesthesia: General | Site: Uterus | Laterality: Bilateral

## 2023-09-29 MED ORDER — SODIUM CHLORIDE 0.9 % IV SOLN
INTRAVENOUS | Status: DC | PRN
  Administered 2023-09-29: 90 mL

## 2023-09-29 MED ORDER — ROPIVACAINE HCL 5 MG/ML IJ SOLN
INTRAMUSCULAR | Status: AC
  Filled 2023-09-29: qty 60

## 2023-09-29 MED ORDER — IBUPROFEN 800 MG PO TABS
800.0000 mg | ORAL_TABLET | Freq: Three times a day (TID) | ORAL | 0 refills | Status: AC | PRN
  Filled 2023-09-29: qty 30, 10d supply, fill #0

## 2023-09-29 MED ORDER — FENTANYL CITRATE (PF) 250 MCG/5ML IJ SOLN
INTRAMUSCULAR | Status: AC
  Filled 2023-09-29: qty 5

## 2023-09-29 MED ORDER — ROCURONIUM BROMIDE 10 MG/ML (PF) SYRINGE
PREFILLED_SYRINGE | INTRAVENOUS | Status: DC | PRN
  Administered 2023-09-29: 30 mg via INTRAVENOUS
  Administered 2023-09-29: 20 mg via INTRAVENOUS
  Administered 2023-09-29: 60 mg via INTRAVENOUS

## 2023-09-29 MED ORDER — DEXAMETHASONE SODIUM PHOSPHATE 10 MG/ML IJ SOLN
INTRAMUSCULAR | Status: DC | PRN
  Administered 2023-09-29: 10 mg via INTRAVENOUS

## 2023-09-29 MED ORDER — OXYCODONE HCL 5 MG PO TABS
5.0000 mg | ORAL_TABLET | Freq: Four times a day (QID) | ORAL | 0 refills | Status: AC | PRN
  Filled 2023-09-29: qty 20, 5d supply, fill #0

## 2023-09-29 MED ORDER — PROPOFOL 1000 MG/100ML IV EMUL
INTRAVENOUS | Status: AC
  Filled 2023-09-29: qty 400

## 2023-09-29 MED ORDER — CEFAZOLIN SODIUM-DEXTROSE 2-4 GM/100ML-% IV SOLN
2.0000 g | INTRAVENOUS | Status: AC
  Administered 2023-09-29: 2 g via INTRAVENOUS
  Filled 2023-09-29: qty 100

## 2023-09-29 MED ORDER — PROPOFOL 10 MG/ML IV BOLUS
INTRAVENOUS | Status: AC
  Filled 2023-09-29: qty 20

## 2023-09-29 MED ORDER — PROPOFOL 10 MG/ML IV BOLUS
INTRAVENOUS | Status: DC | PRN
  Administered 2023-09-29: 120 mg via INTRAVENOUS
  Administered 2023-09-29: 30 mg via INTRAVENOUS

## 2023-09-29 MED ORDER — SODIUM CHLORIDE (PF) 0.9 % IJ SOLN
INTRAMUSCULAR | Status: AC
  Filled 2023-09-29: qty 10

## 2023-09-29 MED ORDER — METHYLENE BLUE (ANTIDOTE) 1 % IV SOLN
INTRAVENOUS | Status: AC
  Filled 2023-09-29: qty 10

## 2023-09-29 MED ORDER — FENTANYL CITRATE (PF) 250 MCG/5ML IJ SOLN
INTRAMUSCULAR | Status: DC | PRN
  Administered 2023-09-29: 100 ug via INTRAVENOUS
  Administered 2023-09-29: 50 ug via INTRAVENOUS
  Administered 2023-09-29: 100 ug via INTRAVENOUS

## 2023-09-29 MED ORDER — SODIUM CHLORIDE (PF) 0.9 % IJ SOLN
INTRAMUSCULAR | Status: AC
  Filled 2023-09-29: qty 50

## 2023-09-29 MED ORDER — DROPERIDOL 2.5 MG/ML IJ SOLN
INTRAMUSCULAR | Status: AC
  Filled 2023-09-29: qty 2

## 2023-09-29 MED ORDER — SODIUM CHLORIDE 0.9 % IR SOLN
Status: DC | PRN
  Administered 2023-09-29: 1000 mL

## 2023-09-29 MED ORDER — DROPERIDOL 2.5 MG/ML IJ SOLN
0.6250 mg | Freq: Once | INTRAMUSCULAR | Status: AC | PRN
  Administered 2023-09-29: 0.625 mg via INTRAVENOUS

## 2023-09-29 MED ORDER — KETOROLAC TROMETHAMINE 15 MG/ML IJ SOLN
INTRAMUSCULAR | Status: DC | PRN
  Administered 2023-09-29: 15 mg via INTRAVENOUS

## 2023-09-29 MED ORDER — MIDAZOLAM HCL 2 MG/2ML IJ SOLN
INTRAMUSCULAR | Status: AC
  Filled 2023-09-29: qty 2

## 2023-09-29 MED ORDER — CHLORHEXIDINE GLUCONATE 0.12 % MT SOLN
15.0000 mL | Freq: Once | OROMUCOSAL | Status: AC
  Administered 2023-09-29: 15 mL via OROMUCOSAL

## 2023-09-29 MED ORDER — ONDANSETRON HCL 4 MG/2ML IJ SOLN
INTRAMUSCULAR | Status: DC | PRN
  Administered 2023-09-29: 4 mg via INTRAVENOUS

## 2023-09-29 MED ORDER — HYDROMORPHONE HCL 1 MG/ML IJ SOLN
INTRAMUSCULAR | Status: AC
  Filled 2023-09-29: qty 1

## 2023-09-29 MED ORDER — SUGAMMADEX SODIUM 200 MG/2ML IV SOLN
INTRAVENOUS | Status: DC | PRN
  Administered 2023-09-29: 131.6 mg via INTRAVENOUS

## 2023-09-29 MED ORDER — LACTATED RINGERS IV SOLN: INTRAVENOUS | Status: DC

## 2023-09-29 MED ORDER — HYDROMORPHONE HCL 1 MG/ML IJ SOLN
0.2500 mg | INTRAMUSCULAR | Status: DC | PRN
  Administered 2023-09-29 (×2): 0.5 mg via INTRAVENOUS

## 2023-09-29 MED ORDER — ACETAMINOPHEN 500 MG PO TABS
1000.0000 mg | ORAL_TABLET | Freq: Once | ORAL | Status: AC
  Administered 2023-09-29: 1000 mg via ORAL
  Filled 2023-09-29: qty 2

## 2023-09-29 MED ORDER — LIDOCAINE 2% (20 MG/ML) 5 ML SYRINGE
INTRAMUSCULAR | Status: DC | PRN
  Administered 2023-09-29: 60 mg via INTRAVENOUS

## 2023-09-29 MED ORDER — MIDAZOLAM HCL 2 MG/2ML IJ SOLN
INTRAMUSCULAR | Status: DC | PRN
  Administered 2023-09-29: 2 mg via INTRAVENOUS

## 2023-09-29 MED ORDER — ORAL CARE MOUTH RINSE: 15.0000 mL | Freq: Once | OROMUCOSAL | Status: AC

## 2023-09-29 MED ORDER — SCOPOLAMINE 1 MG/3DAYS TD PT72
1.0000 | MEDICATED_PATCH | Freq: Once | TRANSDERMAL | Status: DC
  Administered 2023-09-29: 1.5 mg via TRANSDERMAL
  Filled 2023-09-29: qty 1

## 2023-09-29 MED ORDER — PROPOFOL 500 MG/50ML IV EMUL
INTRAVENOUS | Status: DC | PRN
Start: 2023-09-29 — End: 2023-09-29
  Administered 2023-09-29: 125 ug/kg/min via INTRAVENOUS

## 2023-09-29 SURGICAL SUPPLY — 50 items
CANNULA CAP OBTURATR AIRSEAL 8 (CAP) ×1 IMPLANT
COVER BACK TABLE 60X90IN (DRAPES) ×1 IMPLANT
COVER TIP SHEARS 8 DVNC (MISCELLANEOUS) ×1 IMPLANT
DEFOGGER SCOPE WARMER CLEARIFY (MISCELLANEOUS) ×1 IMPLANT
DERMABOND ADVANCED .7 DNX12 (GAUZE/BANDAGES/DRESSINGS) ×1 IMPLANT
DRAPE ARM DVNC X/XI (DISPOSABLE) ×4 IMPLANT
DRAPE COLUMN DVNC XI (DISPOSABLE) ×1 IMPLANT
DRAPE UTILITY W/TAPE 26X15 (DRAPES) ×1 IMPLANT
DRIVER NDL MEGA 8 DVNC XI (INSTRUMENTS) ×1 IMPLANT
DRIVER NDLE MEGA DVNC XI (INSTRUMENTS) ×1
DURAPREP 26ML APPLICATOR (WOUND CARE) ×1 IMPLANT
ELECT REM PT RETURN 9FT ADLT (ELECTROSURGICAL) ×1
ELECTRODE REM PT RTRN 9FT ADLT (ELECTROSURGICAL) ×1 IMPLANT
FORCEPS BPLR LNG DVNC XI (INSTRUMENTS) ×1 IMPLANT
FORCEPS LONG TIP 8 DVNC XI (FORCEP) ×1 IMPLANT
FORCEPS PROGRASP DVNC XI (FORCEP) ×1 IMPLANT
GAUZE 4X4 16PLY ~~LOC~~+RFID DBL (SPONGE) IMPLANT
GLOVE BIO SURGEON STRL SZ 6.5 (GLOVE) ×3 IMPLANT
GLOVE BIO SURGEON STRL SZ7 (GLOVE) IMPLANT
GLOVE BIOGEL PI IND STRL 7.0 (GLOVE) ×5 IMPLANT
GLOVE SURG SS PI 7.0 STRL IVOR (GLOVE) IMPLANT
HIBICLENS CHG 4% 4OZ BTL (MISCELLANEOUS) ×2 IMPLANT
IRRIGATION STRYKERFLOW (MISCELLANEOUS) ×1 IMPLANT
IRRIGATOR STRYKERFLOW (MISCELLANEOUS) ×1
KIT PINK PAD W/HEAD ARE REST (MISCELLANEOUS) ×1
KIT PINK PAD W/HEAD ARM REST (MISCELLANEOUS) ×1 IMPLANT
LEGGING LITHOTOMY PAIR STRL (DRAPES) ×1 IMPLANT
OBTURATOR OPTICAL STND 8 DVNC (TROCAR) ×1
OBTURATOR OPTICALSTD 8 DVNC (TROCAR) ×1 IMPLANT
OCCLUDER COLPOPNEUMO (BALLOONS) ×1 IMPLANT
PACK ROBOT WH (CUSTOM PROCEDURE TRAY) ×1 IMPLANT
PACK ROBOTIC GOWN (GOWN DISPOSABLE) ×1 IMPLANT
PAD OB MATERNITY 4.3X12.25 (PERSONAL CARE ITEMS) ×1 IMPLANT
POWDER SURGICEL 3.0 GRAM (HEMOSTASIS) IMPLANT
SCISSORS LAP 5X35 DISP (ENDOMECHANICALS) IMPLANT
SCISSORS MNPLR CVD DVNC XI (INSTRUMENTS) ×1 IMPLANT
SEAL UNIV 5-12 XI (MISCELLANEOUS) ×4 IMPLANT
SEALER VESSEL EXT DVNC XI (MISCELLANEOUS) ×1 IMPLANT
SET TUBE FILTERED XL AIRSEAL (SET/KITS/TRAYS/PACK) ×1 IMPLANT
SPIKE FLUID TRANSFER (MISCELLANEOUS) ×4 IMPLANT
SUT VIC AB 0 CT1 27XBRD ANBCTR (SUTURE) ×2 IMPLANT
SUT VIC AB CT1 27XBRD ANBCTRL (SUTURE) ×2
SUT VICRYL RAPIDE 4/0 PS 2 (SUTURE) ×2 IMPLANT
SUT VLOC 180 0 9IN GS21 (SUTURE) ×1 IMPLANT
TIP ENDOSCOPIC SURGICEL (TIP) IMPLANT
TIP UTERINE 5.1X6CM LAV DISP (MISCELLANEOUS) IMPLANT
TIP UTERINE 6.7X8CM BLUE DISP (MISCELLANEOUS) IMPLANT
TOWEL GREEN STERILE (TOWEL DISPOSABLE) ×1 IMPLANT
TRAY FOLEY MTR SLVR 14FR STAT (SET/KITS/TRAYS/PACK) ×1 IMPLANT
UNDERPAD 30X36 HEAVY ABSORB (UNDERPADS AND DIAPERS) ×1 IMPLANT

## 2023-09-29 NOTE — Discharge Summary (Addendum)
Physician Discharge Summary  Patient ID: Connie Turner MRN: [id number] DOB/AGE: 40-May-1982 40 y.o.  Admit date: 09/29/2023 Discharge date: 09/29/2023  Admission Diagnoses: CIN III  Discharge Diagnoses:  Principal Problem:   CIN III (cervical intraepithelial neoplasia grade III) with severe dysplasia  Procedure(s): Robotic Assisted Total Laparoscopic Hysterectomy and Bilateral Salpingectomy  Discharged Condition: good  Hospital Course: Patient was admitted on 09/29/2023 for the above named procedure(s) for the above named diagnoses. Prior to hospital discharge, patient was tolerating PO, ambulating, voiding spontaneously, and pain was well-controlled. Patient seen at bedside prior to discharge home. See hospital chart for specific details. Patient was discharged home in stable condition.  Consults: None  Significant Diagnostic Studies: None  Treatments: surgery: See above  Discharge Exam: Blood pressure 115/70, pulse (!) 55, temperature 97.8 F (36.6 C), resp. rate 18, height 5\' 6"  (1.676 m), weight 65.8 kg, last menstrual period 08/19/2023, SpO2 96%. Gen: NAD, well-appearing Abd: non-distended Ext: No bilateral LE edema  Disposition: Discharge disposition: 01-Home or Self Care       Discharge Instructions     Call MD for:   Complete by: As directed    Call with any heavy vaginal bleeding (filling up more than one large pad in an hour).   Call MD for:  difficulty breathing, headache or visual disturbances   Complete by: As directed    Call MD for:  extreme fatigue   Complete by: As directed    Call MD for:  hives   Complete by: As directed    Call MD for:  persistant dizziness or light-headedness   Complete by: As directed    Call MD for:  persistant nausea and vomiting   Complete by: As directed    Call MD for:  redness, tenderness, or signs of infection (pain, swelling, redness, odor or green/yellow discharge around incision site)   Complete by: As  directed    Call MD for:  severe uncontrolled pain   Complete by: As directed    Call MD for:  temperature >100.4   Complete by: As directed    Diet - low sodium heart healthy   Complete by: As directed    Driving Restrictions   Complete by: As directed    No driving for at least 2 weeks.   Increase activity slowly   Complete by: As directed    Lifting restrictions   Complete by: As directed    No lifting greater than 10lbs for 6 weeks.   Sexual Activity Restrictions   Complete by: As directed    No sexual intercourse or objects in the vagina for at least 6-8 weeks.      Allergies as of 09/29/2023   No Known Allergies      Medication List     TAKE these medications    diphenhydrAMINE 25 MG tablet Commonly known as: BENADRYL Take 25 mg by mouth at bedtime as needed for sleep or allergies (allergies).   fluticasone 50 MCG/ACT nasal spray Commonly known as: FLONASE Place 1 spray into both nostrils daily as needed for allergies.   ibuprofen 800 MG tablet Commonly known as: ADVIL Take 1 tablet (800 mg total) by mouth every 8 (eight) hours as needed for moderate pain (pain score 4-6), mild pain (pain score 1-3) or cramping. What changed:  medication strength how much to take reasons to take this   oxyCODONE 5 MG immediate release tablet Commonly known as: Oxy IR/ROXICODONE Take 1 tablet (5 mg total) by mouth every 6 (  six) hours as needed for severe pain (pain score 7-10) or breakthrough pain.        Follow-up Information     Steva Ready, DO Follow up in 2 week(s).   Specialty: Obstetrics and Gynecology Why: Please keep your 2 week and 6 week post-operative visits as previously scheduled. Contact information: [address] [place] [address] [phone]                 Signed: Steva Ready 09/29/2023, 4:09 PM

## 2023-09-29 NOTE — Transfer of Care (Signed)
Immediate Anesthesia Transfer of Care Note  Patient: Connie Turner  Procedure(s) Performed: XI ROBOTIC ASSISTED TOTAL LAPAROSCOPIC HYSTERECTOMY AND BILATERAL SALPINGECTOMY (Bilateral: Uterus)  Patient Location: PACU  Anesthesia Type:General  Level of Consciousness: awake  Airway & Oxygen Therapy: Patient connected to face mask oxygen  Post-op Assessment: Post -op Vital signs reviewed and stable  Post vital signs: stable  Last Vitals:  Vitals Value Taken Time  BP 108/57 09/29/23 1200  Temp    Pulse 79 09/29/23 1202  Resp 17 09/29/23 1202  SpO2 98 % 09/29/23 1202  Vitals shown include unfiled device data.  Last Pain:  Vitals:   09/29/23 0713  TempSrc:   PainSc: 0-No pain         Complications: There were no known notable events for this encounter.

## 2023-09-29 NOTE — Anesthesia Procedure Notes (Signed)
Procedure Name: Intubation Date/Time: 09/22/2023 9:40 AM  Performed by: Hessie Diener, CRNAPre-anesthesia Checklist: Patient identified, Emergency Drugs available, Suction available and Patient being monitored Patient Re-evaluated:Patient Re-evaluated prior to induction Oxygen Delivery Method: Circle System Utilized Preoxygenation: Pre-oxygenation with 100% oxygen Induction Type: IV induction Ventilation: Mask ventilation without difficulty Laryngoscope Size: Mac and 3 Grade View: Grade I Tube type: Oral Tube size: 7.0 mm Number of attempts: 1 Airway Equipment and Method: Stylet and Oral airway Placement Confirmation: ETT inserted through vocal cords under direct vision, positive ETCO2 and breath sounds checked- equal and bilateral Tube secured with: Tape Dental Injury: Teeth and Oropharynx as per pre-operative assessment

## 2023-09-29 NOTE — Anesthesia Postprocedure Evaluation (Signed)
Anesthesia Post Note  Patient: Connie Turner  Procedure(s) Performed: XI ROBOTIC ASSISTED TOTAL LAPAROSCOPIC HYSTERECTOMY AND BILATERAL SALPINGECTOMY (Bilateral: Uterus)     Patient location during evaluation: PACU Anesthesia Type: General Level of consciousness: awake and alert Pain management: pain level controlled Vital Signs Assessment: post-procedure vital signs reviewed and stable Respiratory status: spontaneous breathing, nonlabored ventilation and respiratory function stable Cardiovascular status: blood pressure returned to baseline Postop Assessment: no apparent nausea or vomiting Anesthetic complications: no   There were no known notable events for this encounter.  Last Vitals:  Vitals:   09/29/23 1245 09/29/23 1300  BP: 108/71 116/69  Pulse: (!) 57 61  Resp: 15 18  Temp:  36.6 C  SpO2: 96% 98%    Last Pain:  Vitals:   09/29/23 1300  TempSrc:   PainSc: 1                  Shanda Howells

## 2023-09-29 NOTE — Interval H&P Note (Signed)
History and Physical Interval Note:  09/29/2023 8:41 AM  Connie Turner  has presented today for surgery, with the diagnosis of Cervical Intraepithelial neoplasia III.  The various methods of treatment have been discussed with the patient and family. After consideration of risks, benefits and other options for treatment, the patient has consented to  Procedure(s): XI ROBOTIC ASSISTED TOTAL LAPAROSCOPIC HYSTERECTOMY AND BILATERAL SALPINGECTOMY (Bilateral) as a surgical intervention.  The patient's history has been reviewed, patient examined, no change in status, stable for surgery.  I have reviewed the patient's chart and labs.  Questions were answered to the patient's satisfaction.     Steva Ready
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
## 2023-09-30 ENCOUNTER — Encounter (HOSPITAL_COMMUNITY): Payer: Self-pay | Admitting: Obstetrics and Gynecology

## 2023-10-01 LAB — SURGICAL PATHOLOGY

## 2023-12-29 ENCOUNTER — Other Ambulatory Visit (HOSPITAL_COMMUNITY): Payer: Self-pay

## 2023-12-30 ENCOUNTER — Other Ambulatory Visit (HOSPITAL_COMMUNITY): Payer: Self-pay
